# Patient Record
Sex: Male | Born: 1970
Health system: Southern US, Community
[De-identification: ages and names within clinical notes are randomized; demographics above are authoritative.]

## PROBLEM LIST (undated history)

## (undated) DIAGNOSIS — L0390 Cellulitis, unspecified: Secondary | ICD-10-CM

## (undated) HISTORY — PX: WISDOM TOOTH EXTRACTION: SHX21

---

## 2011-03-23 ENCOUNTER — Emergency Department (INDEPENDENT_AMBULATORY_CARE_PROVIDER_SITE_OTHER): Payer: 59

## 2011-03-23 ENCOUNTER — Emergency Department (HOSPITAL_BASED_OUTPATIENT_CLINIC_OR_DEPARTMENT_OTHER)
Admission: EM | Admit: 2011-03-23 | Discharge: 2011-03-23 | Disposition: A | Discharge status: Home / Self Care | Payer: 59 | Attending: Emergency Medicine | Admitting: Emergency Medicine

## 2011-03-23 DIAGNOSIS — M25579 Pain in unspecified ankle and joints of unspecified foot: Secondary | ICD-10-CM

## 2011-03-23 DIAGNOSIS — S93409A Sprain of unspecified ligament of unspecified ankle, initial encounter: Secondary | ICD-10-CM | POA: Insufficient documentation

## 2011-03-23 DIAGNOSIS — Y92009 Unspecified place in unspecified non-institutional (private) residence as the place of occurrence of the external cause: Secondary | ICD-10-CM | POA: Insufficient documentation

## 2011-03-23 DIAGNOSIS — W108XXA Fall (on) (from) other stairs and steps, initial encounter: Secondary | ICD-10-CM | POA: Insufficient documentation

## 2011-04-16 ENCOUNTER — Other Ambulatory Visit: Payer: Self-pay | Admitting: Otolaryngology

## 2011-04-25 ENCOUNTER — Ambulatory Visit
Admission: RE | Admit: 2011-04-25 | Discharge: 2011-04-25 | Disposition: A | Discharge status: Home / Self Care | Payer: 59 | Source: Ambulatory Visit | Attending: Otolaryngology | Admitting: Otolaryngology

## 2014-07-27 ENCOUNTER — Emergency Department (HOSPITAL_BASED_OUTPATIENT_CLINIC_OR_DEPARTMENT_OTHER)
Admission: EM | Admit: 2014-07-27 | Discharge: 2014-07-27 | Disposition: A | Discharge status: Home / Self Care | Payer: 59 | Attending: Emergency Medicine | Admitting: Emergency Medicine

## 2014-07-27 ENCOUNTER — Emergency Department (HOSPITAL_BASED_OUTPATIENT_CLINIC_OR_DEPARTMENT_OTHER): Payer: 59

## 2014-07-27 ENCOUNTER — Encounter (HOSPITAL_BASED_OUTPATIENT_CLINIC_OR_DEPARTMENT_OTHER): Payer: Self-pay | Admitting: Emergency Medicine

## 2014-07-27 DIAGNOSIS — S2232XA Fracture of one rib, left side, initial encounter for closed fracture: Secondary | ICD-10-CM

## 2014-07-27 DIAGNOSIS — S2242XA Multiple fractures of ribs, left side, initial encounter for closed fracture: Secondary | ICD-10-CM | POA: Insufficient documentation

## 2014-07-27 DIAGNOSIS — Y9389 Activity, other specified: Secondary | ICD-10-CM | POA: Diagnosis not present

## 2014-07-27 DIAGNOSIS — Y9289 Other specified places as the place of occurrence of the external cause: Secondary | ICD-10-CM | POA: Insufficient documentation

## 2014-07-27 DIAGNOSIS — W19XXXA Unspecified fall, initial encounter: Secondary | ICD-10-CM

## 2014-07-27 DIAGNOSIS — S299XXA Unspecified injury of thorax, initial encounter: Secondary | ICD-10-CM | POA: Diagnosis present

## 2014-07-27 MED ORDER — HYDROCODONE-ACETAMINOPHEN 5-325 MG PO TABS: 1.0000 | ORAL_TABLET | Freq: Four times a day (QID) | ORAL | Status: DC | PRN
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
## 2014-07-27 NOTE — Discharge Instructions (Signed)
Hydrocodone as prescribed as needed for pain.  Return to the emergency department if your pain worsens, you develop difficulty breathing, productive cough and fever, or other new and concerning symptoms.   Rib Fracture A rib fracture is a break or crack in one of the bones of the ribs. The ribs are a group of long, curved bones that wrap around your chest and attach to your spine. They protect your lungs and other organs in the chest cavity. A broken or cracked rib is often painful, but most do not cause other problems. Most rib fractures heal on their own over time. However, rib fractures can be more serious if multiple ribs are broken or if broken ribs move out of place and push against other structures. CAUSES   A direct blow to the chest. For example, this could happen during contact sports, a car accident, or a fall against a hard object.  Repetitive movements with high force, such as pitching a baseball or having severe coughing spells. SYMPTOMS   Pain when you breathe in or cough.  Pain when someone presses on the injured area. DIAGNOSIS  Your caregiver will perform a physical exam. Various imaging tests may be ordered to confirm the diagnosis and to look for related injuries. These tests may include a chest X-ray, computed tomography (CT), magnetic resonance imaging (MRI), or a bone scan. TREATMENT  Rib fractures usually heal on their own in 1-3 months. The longer healing period is often associated with a continued cough or other aggravating activities. During the healing period, pain control is very important. Medication is usually given to control pain. Hospitalization or surgery may be needed for more severe injuries, such as those in which multiple ribs are broken or the ribs have moved out of place.  HOME CARE INSTRUCTIONS   Avoid strenuous activity and any activities or movements that cause pain. Be careful during activities and avoid bumping the injured rib.  Gradually  increase activity as directed by your caregiver.  Only take over-the-counter or prescription medications as directed by your caregiver. Do not take other medications without asking your caregiver first.  Apply ice to the injured area for the first 1-2 days after you have been treated or as directed by your caregiver. Applying ice helps to reduce inflammation and pain.  Put ice in a plastic bag.  Place a towel between your skin and the bag.   Leave the ice on for 15-20 minutes at a time, every 2 hours while you are awake.  Perform deep breathing as directed by your caregiver. This will help prevent pneumonia, which is a common complication of a broken rib. Your caregiver may instruct you to:  Take deep breaths several times a day.  Try to cough several times a day, holding a pillow against the injured area.  Use a device called an incentive spirometer to practice deep breathing several times a day.  Drink enough fluids to keep your urine clear or pale yellow. This will help you avoid constipation.   Do not wear a rib belt or binder. These restrict breathing, which can lead to pneumonia.  SEEK IMMEDIATE MEDICAL CARE IF:   You have a fever.   You have difficulty breathing or shortness of breath.   You develop a continual cough, or you cough up thick or bloody sputum.  You feel sick to your stomach (nausea), throw up (vomit), or have abdominal pain.   You have worsening pain not controlled with medications.  MAKE SURE  YOU:  Understand these instructions.  Will watch your condition.  Will get help right away if you are not doing well or get worse. Document Released: 09/15/2005 Document Revised: 05/18/2013 Document Reviewed: 11/17/2012 [facility] Patient Information 2015 [place], [place]. This information is not intended to replace advice given to you by your health care provider. Make sure you discuss any questions you have with your health care provider.
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
## 2015-10-12 ENCOUNTER — Emergency Department (HOSPITAL_BASED_OUTPATIENT_CLINIC_OR_DEPARTMENT_OTHER): Payer: 59

## 2015-10-12 ENCOUNTER — Encounter (HOSPITAL_BASED_OUTPATIENT_CLINIC_OR_DEPARTMENT_OTHER): Payer: Self-pay | Admitting: *Deleted

## 2015-10-12 ENCOUNTER — Emergency Department (HOSPITAL_BASED_OUTPATIENT_CLINIC_OR_DEPARTMENT_OTHER)
Admission: EM | Admit: 2015-10-12 | Discharge: 2015-10-13 | Disposition: A | Discharge status: Home / Self Care | Payer: 59 | Attending: Emergency Medicine | Admitting: Emergency Medicine

## 2015-10-12 DIAGNOSIS — R079 Chest pain, unspecified: Secondary | ICD-10-CM | POA: Diagnosis present

## 2015-10-12 LAB — CBC
HCT: 40.6 % (ref 39.0–52.0)
HEMOGLOBIN: 13.3 g/dL (ref 13.0–17.0)
MCH: 26.9 pg (ref 26.0–34.0)
MCHC: 32.8 g/dL (ref 30.0–36.0)
MCV: 82.2 fL (ref 78.0–100.0)
PLATELETS: 190 10*3/uL (ref 150–400)
RBC: 4.94 MIL/uL (ref 4.22–5.81)
RDW: 12.9 % (ref 11.5–15.5)
WBC: 5.3 10*3/uL (ref 4.0–10.5)

## 2015-10-12 LAB — BASIC METABOLIC PANEL
ANION GAP: 8 (ref 5–15)
BUN: 11 mg/dL (ref 6–20)
CALCIUM: 8.8 mg/dL — AB (ref 8.9–10.3)
CO2: 27 mmol/L (ref 22–32)
CREATININE: 1.18 mg/dL (ref 0.61–1.24)
Chloride: 104 mmol/L (ref 101–111)
Glucose, Bld: 127 mg/dL — ABNORMAL HIGH (ref 65–99)
Potassium: 3.8 mmol/L (ref 3.5–5.1)
SODIUM: 139 mmol/L (ref 135–145)

## 2015-10-12 LAB — TROPONIN I

## 2015-10-12 NOTE — ED Provider Notes (Signed)
CSN: [id number]     Arrival date & time 10/12/15  2016 History  By signing my name below, I, Bethel Born, attest that this documentation has been prepared under the direction and in the presence of Leta Baptist, MD. Electronically Signed: Bethel Born, ED Scribe. 10/12/2015. 10:54 PM   Chief Complaint  Patient presents with  . Chest Pain   The history is provided by the patient. No language interpreter was used.   Zachary York is a 45 y.o. male with no significant PMHx who presents to the Emergency Department complaining of "intense" cramping, non-radiating, left-sided chest pain with onset tonight near 7:30 PM after he had a sandwich and chips for dinner.  He took aspirin and Tums at [place] with some improvement in pain. Additionally the pain improved after belching and he is pain free at present.  This pain initially felt like the heartburn/indigestion that he frequently experiences but more severe. Pt denies SOB but notes that the chest pain took his breath away. Pt states that he has had a low-grade fever and neck pain over the last 2 days that he is treating with Aleve (last dose near 7 PM).  He has no history of medically treated HTN or DM.  His father died of a heart attack at 49. His last stress test was 4-5 years ago and normal.   History reviewed. No pertinent past medical history. History reviewed. No pertinent past surgical history. No family history on file. Social History  Substance Use Topics  . Smoking status: Never Smoker   . Smokeless tobacco: None  . Alcohol Use: Yes    Review of Systems  Constitutional: Negative for fever and chills.  HENT: Negative for congestion, postnasal drip, rhinorrhea and sinus pressure.   Respiratory: Negative for chest tightness, shortness of breath and wheezing.   Cardiovascular: Positive for chest pain.  Gastrointestinal: Negative for nausea, vomiting, abdominal pain, diarrhea and constipation.  Genitourinary: Negative for  dysuria, urgency, frequency and hematuria.  Musculoskeletal: Negative for myalgias and back pain.  Skin: Negative for rash.  Neurological: Negative for dizziness, weakness, light-headedness and headaches.  Hematological: Does not bruise/bleed easily.  All other systems reviewed and are negative.  Allergies  Review of patient's allergies indicates no known allergies.  Home Medications   Prior to Admission medications   Medication Sig Start Date End Date Taking? Authorizing Provider  HYDROcodone-acetaminophen (NORCO) 5-325 MG per tablet Take 1-2 tablets by mouth every 6 (six) hours as needed. 07/27/14   Geoffery Lyons, MD   BP 136/96 mmHg  Pulse 84  Temp(Src) 98.4 F (36.9 C) (Oral)  Resp 19  Ht 6' (1.829 m)  Wt 195 lb (88.451 kg)  BMI 26.44 kg/m2  SpO2 99% Physical Exam  Constitutional: He is oriented to person, place, and time. He appears well-developed and well-nourished.  HENT:  Head: Normocephalic and atraumatic.  Eyes: EOM are normal.  Neck: Normal range of motion.  Cardiovascular: Normal rate, regular rhythm, normal heart sounds and intact distal pulses.   Pulmonary/Chest: Effort normal and breath sounds normal. No respiratory distress.  Abdominal: Soft. He exhibits no distension. There is no tenderness.  Musculoskeletal: Normal range of motion.  Neurological: He is alert and oriented to person, place, and time.  Skin: Skin is warm and dry.  Psychiatric: He has a normal mood and affect. Judgment normal.  Nursing note and vitals reviewed.   ED Course  Procedures (including critical care time) DIAGNOSTIC STUDIES: Oxygen Saturation is 99% on RA,  normal  by my interpretation.    COORDINATION OF CARE: 10:49 PM Discussed treatment plan which includes lab work, CXR, EKG with pt at bedside and pt agreed to plan.  Labs Review Labs Reviewed  BASIC METABOLIC PANEL - Abnormal; Notable for the following:    Glucose, Bld 127 (*)    Calcium 8.8 (*)    All other components  within normal limits  CBC  TROPONIN I    Imaging Review Dg Chest 2 View  10/12/2015  CLINICAL DATA:  Acute onset left-sided chest pain. EXAM: CHEST  2 VIEW COMPARISON:  07/27/2014 FINDINGS: The heart size and mediastinal contours are within normal limits. Both lungs are clear. No evidence of pneumothorax or pleural effusion. The visualized skeletal structures are unremarkable. IMPRESSION: No active cardiopulmonary disease. Electronically Signed   By: Myles RosenthalJohn  Stahl M.D.   On: 10/12/2015 22:06   I have personally reviewed and evaluated these images and lab results as part of my medical decision-making.   EKG Interpretation   Date/Time:  Friday October 12 2015 20:22:19 EST Ventricular Rate:  97 PR Interval:  154 QRS Duration: 94 QT Interval:  326 QTC Calculation: 414 R Axis:   47 Text Interpretation:  Normal sinus rhythm Normal ECG No previous ECGs  available Confirmed by Tamanika Heiney ([id number]) on 10/12/2015 10:16:42 PM      MDM  Patient was seen and evaluated in stable condition. Currently chest pain-free. Took aspirin prior to presentation. EKG unremarkable. Initial laboratory studies including first troponin normal. Heart scored 2-3. This places the patient at low risk for acute cardiac event. Patient signed out pending repeat troponin. Troponin negative plan for discharge. Patient will follow up with his primary care physician. Final diagnoses:  None    1. Chest pain  I personally performed the services described in this documentation, which was scribed in my presence. The recorded information has been reviewed and is accurate.    Leta BaptistEmily Roe Zada Haser, MD 10/13/15 [phone]

## 2015-10-12 NOTE — ED Notes (Addendum)
Chest pain he states is a bad case of indigestion. Pain in his left chest. He took ASA and Tums. He gets relief with belching.

## 2015-10-13 LAB — TROPONIN I: Troponin I: <0.03 ng/mL (ref <0.031)

## 2015-10-13 NOTE — Discharge Instructions (Signed)
You were seen and evaluated tonight for your episode of chest pain. The results from the emergency department are reassuring. The exact cause of your chest pain is unclear although this could be indigestion. Follow-up with your primary care physician for further evaluation. Return with sudden severe symptoms or new concerning symptoms.   Nonspecific Chest Pain  Chest pain can be caused by many different conditions. There is always a chance that your pain could be related to something serious, such as a heart attack or a blood clot in your lungs. Chest pain can also be caused by conditions that are not life-threatening. If you have chest pain, it is very important to follow up with your health care provider. CAUSES  Chest pain can be caused by:  Heartburn.  Pneumonia or bronchitis.  Anxiety or stress.  Inflammation around your heart (pericarditis) or lung (pleuritis or pleurisy).  A blood clot in your lung.  A collapsed lung (pneumothorax). It can develop suddenly on its own (spontaneous pneumothorax) or from trauma to the chest.  Shingles infection (varicella-zoster virus).  Heart attack.  Damage to the bones, muscles, and cartilage that make up your chest wall. This can include:  Bruised bones due to injury.  Strained muscles or cartilage due to frequent or repeated coughing or overwork.  Fracture to one or more ribs.  Sore cartilage due to inflammation (costochondritis). RISK FACTORS  Risk factors for chest pain may include:  Activities that increase your risk for trauma or injury to your chest.  Respiratory infections or conditions that cause frequent coughing.  Medical conditions or overeating that can cause heartburn.  Heart disease or family history of heart disease.  Conditions or health behaviors that increase your risk of developing a blood clot.  Having had chicken pox (varicella zoster). SIGNS AND SYMPTOMS Chest pain can feel like:  Burning or tingling on  the surface of your chest or deep in your chest.  Crushing, pressure, aching, or squeezing pain.  Dull or sharp pain that is worse when you move, cough, or take a deep breath.  Pain that is also felt in your back, neck, shoulder, or arm, or pain that spreads to any of these areas. Your chest pain may come and go, or it may stay constant. DIAGNOSIS Lab tests or other studies may be needed to find the cause of your pain. Your health care provider may have you take a test called an ambulatory ECG (electrocardiogram). An ECG records your heartbeat patterns at the time the test is performed. You may also have other tests, such as:  Transthoracic echocardiogram (TTE). During echocardiography, sound waves are used to create a picture of all of the heart structures and to look at how blood flows through your heart.  Transesophageal echocardiogram (TEE).This is a more advanced imaging test that obtains images from inside your body. It allows your health care provider to see your heart in finer detail.  Cardiac monitoring. This allows your health care provider to monitor your heart rate and rhythm in real time.  Holter monitor. This is a portable device that records your heartbeat and can help to diagnose abnormal heartbeats. It allows your health care provider to track your heart activity for several days, if needed.  Stress tests. These can be done through exercise or by taking medicine that makes your heart beat more quickly.  Blood tests.  Imaging tests. TREATMENT  Your treatment depends on what is causing your chest pain. Treatment may include:  Medicines. These may  include:  Acid blockers for heartburn.  Anti-inflammatory medicine.  Pain medicine for inflammatory conditions.  Antibiotic medicine, if an infection is present.  Medicines to dissolve blood clots.  Medicines to treat coronary artery disease.  Supportive care for conditions that do not require medicines. This may  include:  Resting.  Applying heat or cold packs to injured areas.  Limiting activities until pain decreases. HOME CARE INSTRUCTIONS  If you were prescribed an antibiotic medicine, finish it all even if you start to feel better.  Avoid any activities that bring on chest pain.  Do not use any tobacco products, including cigarettes, chewing tobacco, or electronic cigarettes. If you need help quitting, ask your health care provider.  Do not drink alcohol.  Take medicines only as directed by your health care provider.  Keep all follow-up visits as directed by your health care provider. This is important. This includes any further testing if your chest pain does not go away.  If heartburn is the cause for your chest pain, you may be told to keep your head raised (elevated) while sleeping. This reduces the chance that acid will go from your stomach into your esophagus.  Make lifestyle changes as directed by your health care provider. These may include:  Getting regular exercise. Ask your health care provider to suggest some activities that are safe for you.  Eating a heart-healthy diet. A registered dietitian can help you to learn healthy eating options.  Maintaining a healthy weight.  Managing diabetes, if necessary.  Reducing stress. SEEK MEDICAL CARE IF:  Your chest pain does not go away after treatment.  You have a rash with blisters on your chest.  You have a fever. SEEK IMMEDIATE MEDICAL CARE IF:   Your chest pain is worse.  You have an increasing cough, or you cough up blood.  You have severe abdominal pain.  You have severe weakness.  You faint.  You have chills.  You have sudden, unexplained chest discomfort.  You have sudden, unexplained discomfort in your arms, back, neck, or jaw.  You have shortness of breath at any time.  You suddenly start to sweat, or your skin gets clammy.  You feel nauseous or you vomit.  You suddenly feel light-headed or  dizzy.  Your heart begins to beat quickly, or it feels like it is skipping beats. These symptoms may represent a serious problem that is an emergency. Do not wait to see if the symptoms will go away. Get medical help right away. Call your local emergency services (911 in the [place] Do not drive yourself to the hospital.   This information is not intended to replace advice given to you by your health care provider. Make sure you discuss any questions you have with your health care provider.   Document Released: 06/25/2005 Document Revised: 10/06/2014 Document Reviewed: 04/21/2014 Elsevier Interactive Patient Education [employer].

## 2017-02-13 DIAGNOSIS — H903 Sensorineural hearing loss, bilateral: Secondary | ICD-10-CM | POA: Diagnosis not present

## 2017-03-13 DIAGNOSIS — T161XXA Foreign body in right ear, initial encounter: Secondary | ICD-10-CM | POA: Diagnosis not present

## 2017-06-03 DIAGNOSIS — H903 Sensorineural hearing loss, bilateral: Secondary | ICD-10-CM | POA: Diagnosis not present

## 2017-06-17 DIAGNOSIS — H00014 Hordeolum externum left upper eyelid: Secondary | ICD-10-CM | POA: Diagnosis not present

## 2017-12-28 DIAGNOSIS — L0390 Cellulitis, unspecified: Secondary | ICD-10-CM

## 2017-12-28 HISTORY — DX: Cellulitis, unspecified: L03.90

## 2018-01-16 DIAGNOSIS — K047 Periapical abscess without sinus: Secondary | ICD-10-CM | POA: Diagnosis not present

## 2018-01-19 ENCOUNTER — Emergency Department (HOSPITAL_COMMUNITY): Payer: 59

## 2018-01-19 ENCOUNTER — Encounter (HOSPITAL_COMMUNITY): Payer: Self-pay

## 2018-01-19 ENCOUNTER — Observation Stay (HOSPITAL_COMMUNITY)
Admission: EM | Admit: 2018-01-19 | Discharge: 2018-01-21 | Disposition: A | Discharge status: Home / Self Care | Payer: 59 | Attending: Family Medicine | Admitting: Family Medicine

## 2018-01-19 ENCOUNTER — Other Ambulatory Visit: Payer: Self-pay

## 2018-01-19 DIAGNOSIS — L03221 Cellulitis of neck: Secondary | ICD-10-CM | POA: Diagnosis present

## 2018-01-19 DIAGNOSIS — R221 Localized swelling, mass and lump, neck: Secondary | ICD-10-CM | POA: Diagnosis not present

## 2018-01-19 HISTORY — DX: Cellulitis, unspecified: L03.90

## 2018-01-19 LAB — CBC WITH DIFFERENTIAL/PLATELET
Basophils Absolute: 0 10*3/uL (ref 0.0–0.1)
Basophils Relative: 0 %
EOS ABS: 0.2 10*3/uL (ref 0.0–0.7)
Eosinophils Relative: 2 %
HEMATOCRIT: 43.3 % (ref 39.0–52.0)
HEMOGLOBIN: 14.4 g/dL (ref 13.0–17.0)
LYMPHS ABS: 2.5 10*3/uL (ref 0.7–4.0)
LYMPHS PCT: 28 %
MCH: 28.9 pg (ref 26.0–34.0)
MCHC: 33.3 g/dL (ref 30.0–36.0)
MCV: 86.8 fL (ref 78.0–100.0)
Monocytes Absolute: 0.8 10*3/uL (ref 0.1–1.0)
Monocytes Relative: 9 %
NEUTROS ABS: 5.3 10*3/uL (ref 1.7–7.7)
NEUTROS PCT: 61 %
Platelets: 219 10*3/uL (ref 150–400)
RBC: 4.99 MIL/uL (ref 4.22–5.81)
RDW: 12.7 % (ref 11.5–15.5)
WBC: 8.8 10*3/uL (ref 4.0–10.5)

## 2018-01-19 LAB — BASIC METABOLIC PANEL
Anion gap: 10 (ref 5–15)
BUN: 10 mg/dL (ref 6–20)
CHLORIDE: 101 mmol/L (ref 101–111)
CO2: 25 mmol/L (ref 22–32)
Calcium: 8.9 mg/dL (ref 8.9–10.3)
Creatinine, Ser: 1.03 mg/dL (ref 0.61–1.24)
GFR calc Af Amer: >60 mL/min (ref >60)
GFR calc non Af Amer: >60 mL/min (ref >60)
Glucose, Bld: 91 mg/dL (ref 65–99)
POTASSIUM: 3.8 mmol/L (ref 3.5–5.1)
SODIUM: 136 mmol/L (ref 135–145)

## 2018-01-19 LAB — I-STAT CG4 LACTIC ACID, ED: LACTIC ACID, VENOUS: 1.35 mmol/L (ref 0.5–1.9)

## 2018-01-19 MED ORDER — IOPAMIDOL (ISOVUE-300) INJECTION 61%
75.0000 mL | Freq: Once | INTRAVENOUS | Status: AC | PRN
  Administered 2018-01-19: 75 mL via INTRAVENOUS

## 2018-01-19 MED ORDER — IOPAMIDOL (ISOVUE-300) INJECTION 61%
INTRAVENOUS | Status: AC
  Filled 2018-01-19: qty 100

## 2018-01-19 NOTE — ED Provider Notes (Signed)
Patient placed in Quick Look pathway, seen and evaluated   Chief Complaint: Dental pain, swelling  HPI:   Patient presents for evaluation of facial swelling and neck swelling.  Symptoms began on Friday 5 days ago with left lower jaw pain from an abscess tooth.  [place] in [place]  and was seen at an urgent care on Saturday and discharged with Augmentin, Decadron, oxycodone, and ibuprofen.  He was seen and evaluated by an endodontist yesterday and underwent a root canal and discharged with Flagyl.  He did not require general anesthesia on the local.  He has had left lower facial and jaw swelling since Friday but developed sudden onset anterior neck pain and swelling yesterday which worsened today.  His endodontist recommended he present to the ED for further evaluation.  The patient denies any throat tightness or drooling or difficulty swallowing.  No sore throat.  No fevers.  ROS: Positive for facial swelling, neck pain negative for fevers, chills, shortness of breath, drooling, chest pain  Physical Exam:   Gen: No distress  Neuro: Awake and Alert  Skin: Warm    Focused Exam: Left mandibular facial swelling and tenderness noted.  Patient exhibits trismus and sublingual and submental tenderness to palpation.  There is also an area of swelling and erythema inferiorly to the anterior neck.  This area is tender to palpation.  He is tolerating secretions without difficulty and speaking in full sentences with good phonation.  Normal range of motion of the cervical spine, no pain to the neck anteriorly with range of motion.   Initiation of care has begun. The patient has been counseled on the process, plan, and necessity for staying for the completion/evaluation, and the remainder of the medical screening examination    Bennye AlmFawze, Ramatoulaye Pack A, PA-C 01/19/18 2041    Shon BatonHorton, Courtney F, MD 01/20/18 0127

## 2018-01-19 NOTE — ED Triage Notes (Signed)
Pt presents with redness and swelling to L jaw and neck since yesterday.  Pt had root canal yesterday, reports he's on abx but reports swelling has worsened to neck.  Pt reports he is able to swallow but endodontist referred him here due to swelling.

## 2018-01-20 ENCOUNTER — Other Ambulatory Visit: Payer: Self-pay

## 2018-01-20 ENCOUNTER — Encounter (HOSPITAL_COMMUNITY): Payer: Self-pay | Admitting: Family Medicine

## 2018-01-20 DIAGNOSIS — L03221 Cellulitis of neck: Secondary | ICD-10-CM | POA: Diagnosis not present

## 2018-01-20 LAB — I-STAT CG4 LACTIC ACID, ED: Lactic Acid, Venous: 1.8 mmol/L (ref 0.5–1.9)

## 2018-01-20 LAB — HIV ANTIBODY (ROUTINE TESTING W REFLEX): HIV Screen 4th Generation wRfx: NONREACTIVE

## 2018-01-20 MED ORDER — CLINDAMYCIN PHOSPHATE 600 MG/50ML IV SOLN
600.0000 mg | Freq: Four times a day (QID) | INTRAVENOUS | Status: DC
  Administered 2018-01-20 – 2018-01-21 (×5): 600 mg via INTRAVENOUS
  Filled 2018-01-20 (×8): qty 50

## 2018-01-20 MED ORDER — ONDANSETRON HCL 4 MG/2ML IJ SOLN: 4.0000 mg | Freq: Four times a day (QID) | INTRAMUSCULAR | Status: DC | PRN

## 2018-01-20 MED ORDER — BISACODYL 5 MG PO TBEC: 5.0000 mg | DELAYED_RELEASE_TABLET | Freq: Every day | ORAL | Status: DC | PRN

## 2018-01-20 MED ORDER — HYDROCODONE-ACETAMINOPHEN 5-325 MG PO TABS: 1.0000 | ORAL_TABLET | ORAL | Status: DC | PRN

## 2018-01-20 MED ORDER — SENNOSIDES-DOCUSATE SODIUM 8.6-50 MG PO TABS: 1.0000 | ORAL_TABLET | Freq: Every evening | ORAL | Status: DC | PRN

## 2018-01-20 MED ORDER — KETOROLAC TROMETHAMINE 30 MG/ML IJ SOLN: 30.0000 mg | Freq: Four times a day (QID) | INTRAMUSCULAR | Status: DC | PRN

## 2018-01-20 MED ORDER — ONDANSETRON HCL 4 MG PO TABS: 4.0000 mg | ORAL_TABLET | Freq: Four times a day (QID) | ORAL | Status: DC | PRN

## 2018-01-20 MED ORDER — CLINDAMYCIN PHOSPHATE 600 MG/50ML IV SOLN
600.0000 mg | Freq: Once | INTRAVENOUS | Status: AC
  Administered 2018-01-20: 600 mg via INTRAVENOUS
  Filled 2018-01-20: qty 50

## 2018-01-20 MED ORDER — ACETAMINOPHEN 325 MG PO TABS: 650.0000 mg | ORAL_TABLET | Freq: Four times a day (QID) | ORAL | Status: DC | PRN

## 2018-01-20 MED ORDER — ACETAMINOPHEN 650 MG RE SUPP: 650.0000 mg | Freq: Four times a day (QID) | RECTAL | Status: DC | PRN

## 2018-01-20 MED ORDER — ENOXAPARIN SODIUM 40 MG/0.4ML ~~LOC~~ SOLN
40.0000 mg | SUBCUTANEOUS | Status: DC
  Administered 2018-01-20: 40 mg via SUBCUTANEOUS
  Filled 2018-01-20: qty 0.4

## 2018-01-20 MED ORDER — HYDRALAZINE HCL 20 MG/ML IJ SOLN: 10.0000 mg | INTRAMUSCULAR | Status: DC | PRN

## 2018-01-20 NOTE — ED Notes (Signed)
Pt. Alert and eating, family @ bedside. Vital signs stable at present.

## 2018-01-20 NOTE — Progress Notes (Signed)
PROGRESS NOTE    Constance HolsterWilliam Footman  UJW:[id number]:[id number] DOB: 07/09/1971 DOA: 01/19/2018 PCP: Merri BrunetteSmith, Candace, MD   Brief Narrative:  47 y.o. male who typically enjoys good health, now presenting to the emergency department for evaluation of pain, swelling, and redness involving the left jaw and neck with imaging findings c/w cellulitis.   Assessment & Plan:   Principal Problem:   Cellulitis, neck Active Problems:   Cellulitis of neck   L neck cellulitis: Presented with increased redness, swelling and pain despite abx and root canal on Monday.   Augmentin since Saturday (4/20) and flagyl added Monday (4/22).  - CT with asymmetric soft tissue stranding with inflammatory stranding involving L neck involving the L submandibular and parapharyngeal spaces c/w infection/celllitis (no descrete abscess or drainable fluid collection) - Afebrile, no leukocytosis - Started on IV clindamycin - he feels like things may be slightly improving since then - follow blood cx, MRSA pcr  DVT prophylaxis: lovenox Code Status: full  Family Communication: none at bedside Disposition Plan: pending improvement   Consultants:   none  Procedures:   none  Antimicrobials:  Anti-infectives (From admission, onward)   Start     Dose/Rate Route Frequency Ordered Stop   01/20/18 0800  clindamycin (CLEOCIN) IVPB 600 mg     600 mg 100 mL/hr over 30 Minutes Intravenous Every 6 hours 01/20/18 0150     01/20/18 0115  clindamycin (CLEOCIN) IVPB 600 mg     600 mg 100 mL/hr over 30 Minutes Intravenous  Once 01/20/18 0103 01/20/18 0304       Subjective: No difficulty swallowing. No trouble breathing. Soreness to L side of neck. Seems Yitzchok Carriger bit better since IV abx started.  Objective: Vitals:   01/20/18 0300 01/20/18 0315 01/20/18 0330 01/20/18 0404  BP: (!) 139/104 (!) 127/97 (!) 128/95 (!) 138/97  Pulse: 75 74 73 76  Resp: 18   16  Temp:      TempSrc:      SpO2: 99% 97% 97% 97%  Weight:      Height:         Intake/Output Summary (Last 24 hours) at 01/20/2018 1310 Last data filed at 01/20/2018 0953 Gross per 24 hour  Intake 220 ml  Output -  Net 220 ml   Filed Weights   01/19/18 1940  Weight: 88.5 kg (195 lb)    Examination:  General exam: Appears calm and comfortable  Respiratory system: Clear to auscultation. Respiratory effort normal. HEENT: submandibular TTP on L, TTP underneath tongue on L, but soft Cardiovascular system: S1 & S2 heard, RRR. No JVD, murmurs, rubs, gallops or clicks. No pedal edema. Gastrointestinal system: Abdomen is nondistended, soft and nontender. No organomegaly or masses felt. Normal bowel sounds heard. Central nervous system: Alert and oriented. No focal neurological deficits. Extremities: Symmetric 5 x 5 power. Skin: No rashes, lesions or ulcers Psychiatry: Judgement and insight appear normal. Mood & affect appropriate.     Data Reviewed: I have personally reviewed following labs and imaging studies  CBC: Recent Labs  Lab 01/19/18 2005  WBC 8.8  NEUTROABS 5.3  HGB 14.4  HCT 43.3  MCV 86.8  PLT 219   Basic Metabolic Panel: Recent Labs  Lab 01/19/18 2005  NA 136  K 3.8  CL 101  CO2 25  GLUCOSE 91  BUN 10  CREATININE 1.03  CALCIUM 8.9   GFR: Estimated Creatinine Clearance: 96.3 mL/min (by C-G formula based on SCr of 1.03 mg/dL). Liver Function Tests: No results for  input(s): AST, ALT, ALKPHOS, BILITOT, PROT, ALBUMIN in the last 168 hours. No results for input(s): LIPASE, AMYLASE in the last 168 hours. No results for input(s): AMMONIA in the last 168 hours. Coagulation Profile: No results for input(s): INR, PROTIME in the last 168 hours. Cardiac Enzymes: No results for input(s): CKTOTAL, CKMB, CKMBINDEX, TROPONINI in the last 168 hours. BNP (last 3 results) No results for input(s): PROBNP in the last 8760 hours. HbA1C: No results for input(s): HGBA1C in the last 72 hours. CBG: No results for input(s): GLUCAP in the last 168  hours. Lipid Profile: No results for input(s): CHOL, HDL, LDLCALC, TRIG, CHOLHDL, LDLDIRECT in the last 72 hours. Thyroid Function Tests: No results for input(s): TSH, T4TOTAL, FREET4, T3FREE, THYROIDAB in the last 72 hours. Anemia Panel: No results for input(s): VITAMINB12, FOLATE, FERRITIN, TIBC, IRON, RETICCTPCT in the last 72 hours. Sepsis Labs: Recent Labs  Lab 01/19/18 2108 01/20/18 0133  LATICACIDVEN 1.35 1.80    No results found for this or any previous visit (from the past 240 hour(s)).       Radiology Studies: Ct Soft Tissue Neck W Contrast  Result Date: 01/19/2018 CLINICAL DATA:  Initial evaluation for acute left-sided neck swelling, recent dental surgery. EXAM: CT NECK WITH CONTRAST TECHNIQUE: Multidetector CT imaging of the neck was performed using the standard protocol following the bolus administration of intravenous contrast. CONTRAST:  75mL ISOVUE-300 IOPAMIDOL (ISOVUE-300) INJECTION 61% COMPARISON:  None. FINDINGS: Pharynx and larynx: Oral cavity within normal limits without mass lesion or loculated fluid collection. There is asymmetric soft tissue swelling with inflammatory stranding adjacent to the left mandibular body, with additional swelling and inflammatory stranding within the overlying left masticator and submandibular spaces. Findings concerning for possible infection/cellulitis. Asymmetric thickening of the left platysmas. Inflammatory stranding extends inferiorly down the anterior aspect of the left and central neck. Extension into the left parapharyngeal space with mild induration within the left parapharyngeal fat. No discrete abscess or drainable fluid collection identified. Oropharynx within normal limits. Palatine tonsils symmetric and within normal limits. Nasopharynx normal. No retropharyngeal collection. Minimal asymmetric edema at the left epiglottis/aryepiglottic folds related to the inflammatory process within the left neck. Remainder of the hypopharynx  and supraglottic larynx within normal limits. True cords symmetric and normal. Subglottic airway clear. Salivary glands: Parotid glands within normal limits. Inflammatory stranding surrounds the left submandibular gland while the gland itself is normal in appearance. Right submandibular gland normal. Thyroid: Approximately 1 cm hypodense nodule noted within the inferior left thyroid lobe, of doubtful significance. Thyroid otherwise unremarkable. Lymph nodes: Mild asymmetric prominence of nonenlarged left level IB nodes, likely reactive. No pathologically enlarged lymph nodes identified within the neck. Vascular: Normal intravascular enhancement seen throughout the neck. Small focus of gas within the left IJ likely related IV access/injection. Limited intracranial: Unremarkable. Visualized orbits: Visualized globes and orbital soft tissues within normal limits. Mastoids and visualized paranasal sinuses: Visualized paranasal sinuses are clear. Mastoids and middle ear cavities are clear. Skeleton: No acute osseous abnormality. No worrisome lytic or blastic osseous lesions. Mild degenerate spondylolysis noted at C5-6 and C6-7. Upper chest: Visualized upper chest within normal limits. Partially visualized lungs are clear. Other: None. IMPRESSION: Asymmetric soft tissue stranding with inflammatory stranding involving the left neck, primarily involving the left submandibular and parapharyngeal spaces, most consistent with infection/cellulitis. No discrete abscess or drainable fluid collection identified. Electronically Signed   By: Rise Mu M.D.   On: 01/19/2018 22:05        Scheduled Meds: .  enoxaparin (LOVENOX) injection  40 mg Subcutaneous Q24H   Continuous Infusions: . clindamycin (CLEOCIN) IV Stopped (01/20/18 1247)     LOS: 0 days    Time spent: over 30 min    Lacretia Nicks, MD [facility] Pager [phone]  If 7PM-7AM, please contact  night-coverage [email or web address] Password [email or web address] 01/20/2018, 1:10 PM

## 2018-01-20 NOTE — H&P (Signed)
History and Physical    Zachary HolsterWilliam York UJW:[id number]:[id number] DOB: 12/05/1970 DOA: 01/19/2018  PCP: Merri BrunetteSmith, Candace, MD   Patient coming from: Home  Chief Complaint: Left neck and jaw swelling   HPI: Zachary HolsterWilliam York is a 47 y.o. male who typically enjoys good health, now presenting to the emergency department for evaluation of pain, swelling, and redness involving the left jaw and neck.  Patient developed pain, redness, and swelling localized to the left jaw approximately 6 days ago, was seen in an urgent care and started on Augmentin at that time for odontogenic infection.  He then underwent root canal on 01/18/2018 and Flagyl was added to his Augmentin that he has continued.  Today, he noted increasing swelling, redness, and discomfort extending to the left neck.  He denies any difficulty swallowing, denies drooling, and denies any stridor or noisy breathing.  There has not been any fevers or chills.  ED Course: Upon arrival to the ED, patient is found to be afebrile, saturating well on room air, hypertensive, and vitals otherwise normal.  Chemistry panel and CBC are unremarkable and lactic acid is reassuringly normal.  CT neck soft tissue reveals soft tissue stranding involving the left neck, primarily left submandibular and parapharyngeal spaces without fluid collection, consistent with cellulitis.  Blood cultures were collected in the ED and patient was started on IV clindamycin.  He remains hemodynamically stable, in no apparent respiratory distress, and will be observed on the medical-surgical unit for ongoing evaluation and management of worsening cellulitis despite outpatient antibiotics.  Review of Systems:  All other systems reviewed and apart from HPI, are negative.  History reviewed. No pertinent past medical history.  History reviewed. No pertinent surgical history.   reports that he has never smoked. He has never used smokeless tobacco. He reports that he drinks alcohol. His drug history is not  on file.  No Known Allergies  History reviewed. No pertinent family history.   Prior to Admission medications   Medication Sig Start Date End Date Taking? Authorizing Provider  amoxicillin-clavulanate (AUGMENTIN) 875-125 MG tablet Take 1 tablet by mouth 2 (two) times daily. for 10 days 01/16/18  Yes [provider]  ibuprofen (ADVIL,MOTRIN) 800 MG tablet Take 800 mg by mouth every 8 (eight) hours as needed for moderate pain.  01/16/18  Yes [provider]  metroNIDAZOLE (FLAGYL) 500 MG tablet Take 500 mg by mouth every 6 (six) hours. 01/18/18  Yes [provider]    Physical Exam: Vitals:   01/19/18 1940 01/19/18 2229  BP: (!) 152/115 (!) 162/104  Pulse: 75 72  Resp: 18 18  Temp: 98.5 F (36.9 C)   TempSrc: Oral   SpO2: 100% 97%  Weight: 88.5 kg (195 lb)   Height: 6' (1.829 m)       Constitutional: NAD, calm  Eyes: PERTLA, lids and conjunctivae normal ENMT: Mucous membranes are moist. Posterior pharynx clear of any exudate or lesions.   Neck: erythema, edema, and tenderness involving left submandibular region and extending to anterior left neck. No fluctuance or drainage.  Respiratory: clear to auscultation bilaterally, no wheezing, no crackles. Normal respiratory effort.    Cardiovascular: S1 & S2 heard, regular rate and rhythm. No extremity edema.   Abdomen: No distension, no tenderness, soft. Bowel sounds normal.  Musculoskeletal: no clubbing / cyanosis. No joint deformity upper and lower extremities.   Skin: no significant rashes, lesions, ulcers. Warm, dry, well-perfused. Neurologic: CN 2-12 grossly intact. Sensation intact. Strength 5/5 in all 4 limbs.  Psychiatric:  Alert and oriented x 3. Calm, cooperative.     Labs on Admission: I have personally reviewed following labs and imaging studies  CBC: Recent Labs  Lab 01/19/18 2005  WBC 8.8  NEUTROABS 5.3  HGB 14.4  HCT 43.3  MCV 86.8  PLT 219   Basic Metabolic Panel: Recent Labs    Lab 01/19/18 2005  NA 136  K 3.8  CL 101  CO2 25  GLUCOSE 91  BUN 10  CREATININE 1.03  CALCIUM 8.9   GFR: Estimated Creatinine Clearance: 96.3 mL/min (by C-G formula based on SCr of 1.03 mg/dL). Liver Function Tests: No results for input(s): AST, ALT, ALKPHOS, BILITOT, PROT, ALBUMIN in the last 168 hours. No results for input(s): LIPASE, AMYLASE in the last 168 hours. No results for input(s): AMMONIA in the last 168 hours. Coagulation Profile: No results for input(s): INR, PROTIME in the last 168 hours. Cardiac Enzymes: No results for input(s): CKTOTAL, CKMB, CKMBINDEX, TROPONINI in the last 168 hours. BNP (last 3 results) No results for input(s): PROBNP in the last 8760 hours. HbA1C: No results for input(s): HGBA1C in the last 72 hours. CBG: No results for input(s): GLUCAP in the last 168 hours. Lipid Profile: No results for input(s): CHOL, HDL, LDLCALC, TRIG, CHOLHDL, LDLDIRECT in the last 72 hours. Thyroid Function Tests: No results for input(s): TSH, T4TOTAL, FREET4, T3FREE, THYROIDAB in the last 72 hours. Anemia Panel: No results for input(s): VITAMINB12, FOLATE, FERRITIN, TIBC, IRON, RETICCTPCT in the last 72 hours. Urine analysis: No results found for: COLORURINE, APPEARANCEUR, LABSPEC, PHURINE, GLUCOSEU, HGBUR, BILIRUBINUR, KETONESUR, PROTEINUR, UROBILINOGEN, NITRITE, LEUKOCYTESUR Sepsis Labs: @LABRCNTIP (procalcitonin:4,lacticidven:4) )No results found for this or any previous visit (from the past 240 hour(s)).   Radiological Exams on Admission: Ct Soft Tissue Neck W Contrast  Result Date: 01/19/2018 CLINICAL DATA:  Initial evaluation for acute left-sided neck swelling, recent dental surgery. EXAM: CT NECK WITH CONTRAST TECHNIQUE: Multidetector CT imaging of the neck was performed using the standard protocol following the bolus administration of intravenous contrast. CONTRAST:  75mL ISOVUE-300 IOPAMIDOL (ISOVUE-300) INJECTION 61% COMPARISON:  None. FINDINGS:  Pharynx and larynx: Oral cavity within normal limits without mass lesion or loculated fluid collection. There is asymmetric soft tissue swelling with inflammatory stranding adjacent to the left mandibular body, with additional swelling and inflammatory stranding within the overlying left masticator and submandibular spaces. Findings concerning for possible infection/cellulitis. Asymmetric thickening of the left platysmas. Inflammatory stranding extends inferiorly down the anterior aspect of the left and central neck. Extension into the left parapharyngeal space with mild induration within the left parapharyngeal fat. No discrete abscess or drainable fluid collection identified. Oropharynx within normal limits. Palatine tonsils symmetric and within normal limits. Nasopharynx normal. No retropharyngeal collection. Minimal asymmetric edema at the left epiglottis/aryepiglottic folds related to the inflammatory process within the left neck. Remainder of the hypopharynx and supraglottic larynx within normal limits. True cords symmetric and normal. Subglottic airway clear. Salivary glands: Parotid glands within normal limits. Inflammatory stranding surrounds the left submandibular gland while the gland itself is normal in appearance. Right submandibular gland normal. Thyroid: Approximately 1 cm hypodense nodule noted within the inferior left thyroid lobe, of doubtful significance. Thyroid otherwise unremarkable. Lymph nodes: Mild asymmetric prominence of nonenlarged left level IB nodes, likely reactive. No pathologically enlarged lymph nodes identified within the neck. Vascular: Normal intravascular enhancement seen throughout the neck. Small focus of gas within the left IJ likely related IV access/injection. Limited intracranial: Unremarkable. Visualized orbits: Visualized globes and orbital soft tissues within  normal limits. Mastoids and visualized paranasal sinuses: Visualized paranasal sinuses are clear. Mastoids and  middle ear cavities are clear. Skeleton: No acute osseous abnormality. No worrisome lytic or blastic osseous lesions. Mild degenerate spondylolysis noted at C5-6 and C6-7. Upper chest: Visualized upper chest within normal limits. Partially visualized lungs are clear. Other: None. IMPRESSION: Asymmetric soft tissue stranding with inflammatory stranding involving the left neck, primarily involving the left submandibular and parapharyngeal spaces, most consistent with infection/cellulitis. No discrete abscess or drainable fluid collection identified. Electronically Signed   By: Rise Mu M.D.   On: 01/19/2018 22:05    EKG: Not performed.   Assessment/Plan   1. Cellulitis left neck  - Developed pain and swelling to left jaw and neck 6 days ago secondary to dental infection; he now presents with increased swelling despite root canal and oral antibiotics  - No fever or leukocytosis; lactate normal  - CT with inflammatory stranding involving left submandibular and parapharyngeal space without fluid collection, bilateral involvement (Ludwig's), or airway compromise  - Blood cultures collected in ED and IV clindamycin given  - Continue clindamycin, follow cultures and clinical course    DVT prophylaxis: Lovenox Code Status: Full  Family Communication: Discussed with patient Consults called: none Admission status: Observation     Briscoe Deutscher, MD [facility] Pager [phone]  If 7PM-7AM, please contact night-coverage [email or web address] Password [email or web address]  01/20/2018, 1:51 AM

## 2018-01-20 NOTE — ED Provider Notes (Signed)
[facility] Provider Note   CSN: [id number] Arrival date & time: 01/19/18  1732     History   Chief Complaint Chief Complaint  Patient presents with  . Neck Pain    HPI Constance HolsterWilliam Croson is a 47 y.o. male.  HPI  This is a 47 year old male with no significant past medical history who presents with progressive left jaw swelling and neck swelling.  Patient reports that he was seen and evaluated on Thursday for tooth pain.  He was put on Augmentin, Decadron, ibuprofen.  He followed up with his endodontist and had a root canal on Monday morning.  He is currently taking Augmentin and Flagyl.  Patient states that he had been doing well.  However, yesterday he woke up from an afternoon nap and noted increased swelling and redness that seemed to migrate down and across the neck.  He denies any significant pain.  He also feels some fullness in his mouth.  He denies any difficulty swallowing or airway issues.  He has not had any fevers.  He followed up at his endodontist yesterday and was told to come to the ER.   History reviewed. No pertinent past medical history.  There are no active problems to display for this patient.   History reviewed. No pertinent surgical history.      Home Medications    Prior to Admission medications   Medication Sig Start Date End Date Taking? Authorizing Provider  amoxicillin-clavulanate (AUGMENTIN) 875-125 MG tablet Take 1 tablet by mouth 2 (two) times daily. for 10 days 01/16/18  Yes [provider]  ibuprofen (ADVIL,MOTRIN) 800 MG tablet Take 800 mg by mouth every 8 (eight) hours as needed for moderate pain.  01/16/18  Yes [provider]  metroNIDAZOLE (FLAGYL) 500 MG tablet Take 500 mg by mouth every 6 (six) hours. 01/18/18  Yes [provider]  HYDROcodone-acetaminophen (NORCO) 5-325 MG per tablet Take 1-2 tablets by mouth every 6 (six) hours as needed. Patient not taking: Reported on  01/20/2018 07/27/14   Geoffery Lyonselo, Douglas, MD    Family History History reviewed. No pertinent family history.  Social History Social History   Tobacco Use  . Smoking status: Never Smoker  . Smokeless tobacco: Never Used  Substance Use Topics  . Alcohol use: Yes  . Drug use: Not on file     Allergies   Patient has no known allergies.   Review of Systems Review of Systems  Constitutional: Negative for fever.  HENT: Positive for dental problem. Negative for sore throat and trouble swallowing.   Respiratory: Negative for shortness of breath.   Cardiovascular: Negative for chest pain.  Gastrointestinal: Negative for nausea and vomiting.  Musculoskeletal: Negative for neck pain.  Skin: Positive for color change.  All other systems reviewed and are negative.    Physical Exam Updated Vital Signs BP (!) 162/104 (BP Location: Right Arm)   Pulse 72   Temp 98.5 F (36.9 C) (Oral)   Resp 18   Ht 6' (1.829 m)   Wt 88.5 kg (195 lb)   SpO2 97%   BMI 26.45 kg/m   Physical Exam  Constitutional: He is oriented to person, place, and time. He appears well-developed and well-nourished.  Well-appearing, ABCs intact  HENT:  Head: Normocephalic and atraumatic.  Slight fullness noted under the tongue/ligual area Site of root canal without any immediate complications  Neck: Neck supple. No tracheal deviation present.  Slight tenderness to palpation with fullness in the left  submental region, blanching erythema extending medially  Cardiovascular: Normal rate, regular rhythm and normal heart sounds.  No murmur heard. Pulmonary/Chest: Effort normal and breath sounds normal. No respiratory distress. He has no wheezes.  Musculoskeletal: He exhibits no edema.  Lymphadenopathy:    He has no cervical adenopathy.  Neurological: He is alert and oriented to person, place, and time.  Skin: Skin is warm and dry.  Psychiatric: He has a normal mood and affect.  Nursing note and vitals  reviewed.    ED Treatments / Results  Labs (all labs ordered are listed, but only abnormal results are displayed) Labs Reviewed  CULTURE, BLOOD (ROUTINE X 2)  CULTURE, BLOOD (ROUTINE X 2)  CBC WITH DIFFERENTIAL/PLATELET  BASIC METABOLIC PANEL  I-STAT CG4 LACTIC ACID, ED  I-STAT CG4 LACTIC ACID, ED    EKG None  Radiology Ct Soft Tissue Neck W Contrast  Result Date: 01/19/2018 CLINICAL DATA:  Initial evaluation for acute left-sided neck swelling, recent dental surgery. EXAM: CT NECK WITH CONTRAST TECHNIQUE: Multidetector CT imaging of the neck was performed using the standard protocol following the bolus administration of intravenous contrast. CONTRAST:  75mL ISOVUE-300 IOPAMIDOL (ISOVUE-300) INJECTION 61% COMPARISON:  None. FINDINGS: Pharynx and larynx: Oral cavity within normal limits without mass lesion or loculated fluid collection. There is asymmetric soft tissue swelling with inflammatory stranding adjacent to the left mandibular body, with additional swelling and inflammatory stranding within the overlying left masticator and submandibular spaces. Findings concerning for possible infection/cellulitis. Asymmetric thickening of the left platysmas. Inflammatory stranding extends inferiorly down the anterior aspect of the left and central neck. Extension into the left parapharyngeal space with mild induration within the left parapharyngeal fat. No discrete abscess or drainable fluid collection identified. Oropharynx within normal limits. Palatine tonsils symmetric and within normal limits. Nasopharynx normal. No retropharyngeal collection. Minimal asymmetric edema at the left epiglottis/aryepiglottic folds related to the inflammatory process within the left neck. Remainder of the hypopharynx and supraglottic larynx within normal limits. True cords symmetric and normal. Subglottic airway clear. Salivary glands: Parotid glands within normal limits. Inflammatory stranding surrounds the left  submandibular gland while the gland itself is normal in appearance. Right submandibular gland normal. Thyroid: Approximately 1 cm hypodense nodule noted within the inferior left thyroid lobe, of doubtful significance. Thyroid otherwise unremarkable. Lymph nodes: Mild asymmetric prominence of nonenlarged left level IB nodes, likely reactive. No pathologically enlarged lymph nodes identified within the neck. Vascular: Normal intravascular enhancement seen throughout the neck. Small focus of gas within the left IJ likely related IV access/injection. Limited intracranial: Unremarkable. Visualized orbits: Visualized globes and orbital soft tissues within normal limits. Mastoids and visualized paranasal sinuses: Visualized paranasal sinuses are clear. Mastoids and middle ear cavities are clear. Skeleton: No acute osseous abnormality. No worrisome lytic or blastic osseous lesions. Mild degenerate spondylolysis noted at C5-6 and C6-7. Upper chest: Visualized upper chest within normal limits. Partially visualized lungs are clear. Other: None. IMPRESSION: Asymmetric soft tissue stranding with inflammatory stranding involving the left neck, primarily involving the left submandibular and parapharyngeal spaces, most consistent with infection/cellulitis. No discrete abscess or drainable fluid collection identified. Electronically Signed   By: Rise Mu M.D.   On: 01/19/2018 22:05    Procedures Procedures (including critical care time)  Medications Ordered in ED Medications  iopamidol (ISOVUE-300) 61 % injection (has no administration in time range)  clindamycin (CLEOCIN) IVPB 600 mg (has no administration in time range)  iopamidol (ISOVUE-300) 61 % injection 75 mL (75 mLs Intravenous Contrast Given  01/19/18 2126)     Initial Impression / Assessment and Plan / ED Course  I have reviewed the triage vital signs and the nursing notes.  Pertinent labs & imaging results that were available during my care of  the patient were reviewed by me and considered in my medical decision making (see chart for details).     Patient presents with worsening swelling and redness over the neck post root canal.  He has some slight fullness under the tongue.  He is nontoxic-appearing and vital signs are reassuring.  ABCs are intact.  No significant leukocytosis.  Concern would be for possible Ludwig's following a dental procedure.  CT scan only showed cellulitis of the neck.  While this is reassuring, patient is on appropriate oral flora antibiotics of Augmentin and Flagyl.  Would be concerned with progression of cellulitis while on what appears to be appropriate antibiotics.  Patient was given IV clindamycin.  Blood cultures obtained.  Will admit for observation, antibiotics to ensure no further progression.  This was discussed with Dr. Antionette Char.    Final Clinical Impressions(s) / ED Diagnoses   Final diagnoses:  Cellulitis, neck    ED Discharge Orders    None       Shon Baton, MD 01/20/18 [phone]

## 2018-01-21 DIAGNOSIS — L03221 Cellulitis of neck: Secondary | ICD-10-CM | POA: Diagnosis not present

## 2018-01-21 LAB — BASIC METABOLIC PANEL
Anion gap: 9 (ref 5–15)
BUN: 9 mg/dL (ref 6–20)
CHLORIDE: 104 mmol/L (ref 101–111)
CO2: 26 mmol/L (ref 22–32)
Calcium: 8.7 mg/dL — ABNORMAL LOW (ref 8.9–10.3)
Creatinine, Ser: 1.12 mg/dL (ref 0.61–1.24)
GFR calc non Af Amer: >60 mL/min (ref >60)
Glucose, Bld: 103 mg/dL — ABNORMAL HIGH (ref 65–99)
POTASSIUM: 4.2 mmol/L (ref 3.5–5.1)
SODIUM: 139 mmol/L (ref 135–145)

## 2018-01-21 LAB — CBC
HEMATOCRIT: 43.4 % (ref 39.0–52.0)
HEMOGLOBIN: 14.2 g/dL (ref 13.0–17.0)
MCH: 28.2 pg (ref 26.0–34.0)
MCHC: 32.7 g/dL (ref 30.0–36.0)
MCV: 86.3 fL (ref 78.0–100.0)
Platelets: 219 10*3/uL (ref 150–400)
RBC: 5.03 MIL/uL (ref 4.22–5.81)
RDW: 12.7 % (ref 11.5–15.5)
WBC: 8.1 10*3/uL (ref 4.0–10.5)

## 2018-01-21 LAB — MRSA PCR SCREENING: MRSA BY PCR: NEGATIVE

## 2018-01-21 MED ORDER — CLINDAMYCIN HCL 150 MG PO CAPS: 450.0000 mg | ORAL_CAPSULE | Freq: Four times a day (QID) | ORAL | 0 refills | Status: AC

## 2018-01-21 NOTE — Discharge Summary (Signed)
Physician Discharge Summary  Zachary York ZOX:[id number] DOB: 20-Mar-1971 DOA: 01/19/2018  PCP: Merri Brunette, MD  Admit date: 01/19/2018 Discharge date: 01/21/2018  Time spent: 35 minutes  Recommendations for Outpatient Follow-up:  1. Follow up outpatient CBC/CMP 2. Follow up final cultures 3. Ensure follow up with endodontist and completion of course of clinda and continued improvement   Discharge Diagnoses:  Principal Problem:   Cellulitis, neck Active Problems:   Cellulitis of neck   Discharge Condition: stable  Diet recommendation: heart healthy  Filed Weights   01/19/18 1940  Weight: 88.5 kg (195 lb)    History of present illness:  47 y.o.malewho typically enjoys good health, now presenting to the emergency department for evaluation of pain, swelling, and redness involving the left jaw and neck with imaging findings c/w cellulitis.   He improved on IV clindamycin.  He was discharged with PO clindamycin with plans to follow up with endodontist as outpatient. Given strict return precautions.  Hospital Course:   L neck cellulitis: Presented with increased redness, swelling and pain despite abx and root canal on Monday.   Augmentin since Saturday (4/20) and flagyl added Monday (4/22).  Since starting clindamycin, pt notes subjective improvement in pain and swelling.  Seems less tender on exam as well.  - CT with asymmetric soft tissue stranding with inflammatory stranding involving L neck involving the L submandibular and parapharyngeal spaces c/w infection/celllitis (no descrete abscess or drainable fluid collection) - Afebrile, no leukocytosis - Clindamycin on d/c - follow up with PCP and endodontist - strict return precautions given - follow blood cx, MRSA pcr negative  Procedures:  none   Consultations:  none  Discharge Exam: Vitals:   01/20/18 2208 01/21/18 0541  BP: (!) 164/108 (!) 141/100  Pulse: 72 75  Resp:    Temp: 98.5 F (36.9 C) 97.6 F (36.4  C)  SpO2: 98% 99%   Feeling better. Feels like swelling is down. Feels less with swallowing.  Pain is better.  General: No acute distress. HEENT  NECK:  TTP, swelling and redness to L neck.  Submandibular region.  Seems improved.  TTP under tongue on L, but soft.  Seems improved.  ~2 finger trismus, stable. Cardiovascular: Heart sounds show a regular rate, and rhythm. No gallops or rubs. No murmurs. No JVD. Lungs: Clear to auscultation bilaterally with good air movement. No rales, rhonchi or wheezes. Abdomen: Soft, nontender, nondistended with normal active bowel sounds. No masses. No hepatosplenomegaly. Neurological: Alert and oriented 3. Moves all extremities 4. Cranial nerves II through XII grossly intact. Skin: Warm and dry. No rashes or lesions. Extremities: No clubbing or cyanosis. No edema. Pedal pulses 2+. Psychiatric: Mood and affect are normal. Insight and judgment are appropriate.  Discharge Instructions   Discharge Instructions    Call MD for:  difficulty breathing, headache or visual disturbances   Complete by:  As directed    Call MD for:  persistant dizziness or light-headedness   Complete by:  As directed    Call MD for:  persistant nausea and vomiting   Complete by:  As directed    Call MD for:  redness, tenderness, or signs of infection (pain, swelling, redness, odor or green/yellow discharge around incision site)   Complete by:  As directed    Call MD for:  severe uncontrolled pain   Complete by:  As directed    Call MD for:  temperature >100.4   Complete by:  As directed    Diet - low sodium  heart healthy   Complete by:  As directed    Discharge instructions   Complete by:  As directed    You were seen for cellulitis of the neck.  You've improved with IV clindamycin.  We will discharge you with this antibiotic for an additional 13 days.  Please take this as prescribed.  Please call to see if you can get an earlier appointment with your endodontist  (possibly tomorrow if possible).  Otherwise, keep your scheduled appointment on Tuesday.  Return for any worsening symptoms.   Please follow up with your PCP in a few days to follow up labs and your hospitalization.   Please ask your PCP to request records from this hospitalization so they know what was done and what the next steps will be.   Increase activity slowly   Complete by:  As directed      Allergies as of 01/21/2018   No Known Allergies     Medication List    STOP taking these medications   amoxicillin-clavulanate 875-125 MG tablet Commonly known as:  AUGMENTIN   metroNIDAZOLE 500 MG tablet Commonly known as:  FLAGYL     TAKE these medications   clindamycin 150 MG capsule Commonly known as:  CLEOCIN Take 3 capsules (450 mg total) by mouth 4 (four) times daily for 13 days.   ibuprofen 800 MG tablet Commonly known as:  ADVIL,MOTRIN Take 800 mg by mouth every 8 (eight) hours as needed for moderate pain.      No Known Allergies    The results of significant diagnostics from this hospitalization (including imaging, microbiology, ancillary and laboratory) are listed below for reference.    Significant Diagnostic Studies: Ct Soft Tissue Neck W Contrast  Result Date: 01/19/2018 CLINICAL DATA:  Initial evaluation for acute left-sided neck swelling, recent dental surgery. EXAM: CT NECK WITH CONTRAST TECHNIQUE: Multidetector CT imaging of the neck was performed using the standard protocol following the bolus administration of intravenous contrast. CONTRAST:  75mL ISOVUE-300 IOPAMIDOL (ISOVUE-300) INJECTION 61% COMPARISON:  None. FINDINGS: Pharynx and larynx: Oral cavity within normal limits without mass lesion or loculated fluid collection. There is asymmetric soft tissue swelling with inflammatory stranding adjacent to the left mandibular body, with additional swelling and inflammatory stranding within the overlying left masticator and submandibular spaces. Findings  concerning for possible infection/cellulitis. Asymmetric thickening of the left platysmas. Inflammatory stranding extends inferiorly down the anterior aspect of the left and central neck. Extension into the left parapharyngeal space with mild induration within the left parapharyngeal fat. No discrete abscess or drainable fluid collection identified. Oropharynx within normal limits. Palatine tonsils symmetric and within normal limits. Nasopharynx normal. No retropharyngeal collection. Minimal asymmetric edema at the left epiglottis/aryepiglottic folds related to the inflammatory process within the left neck. Remainder of the hypopharynx and supraglottic larynx within normal limits. True cords symmetric and normal. Subglottic airway clear. Salivary glands: Parotid glands within normal limits. Inflammatory stranding surrounds the left submandibular gland while the gland itself is normal in appearance. Right submandibular gland normal. Thyroid: Approximately 1 cm hypodense nodule noted within the inferior left thyroid lobe, of doubtful significance. Thyroid otherwise unremarkable. Lymph nodes: Mild asymmetric prominence of nonenlarged left level IB nodes, likely reactive. No pathologically enlarged lymph nodes identified within the neck. Vascular: Normal intravascular enhancement seen throughout the neck. Small focus of gas within the left IJ likely related IV access/injection. Limited intracranial: Unremarkable. Visualized orbits: Visualized globes and orbital soft tissues within normal limits. Mastoids and visualized paranasal sinuses: Visualized  paranasal sinuses are clear. Mastoids and middle ear cavities are clear. Skeleton: No acute osseous abnormality. No worrisome lytic or blastic osseous lesions. Mild degenerate spondylolysis noted at C5-6 and C6-7. Upper chest: Visualized upper chest within normal limits. Partially visualized lungs are clear. Other: None. IMPRESSION: Asymmetric soft tissue stranding with  inflammatory stranding involving the left neck, primarily involving the left submandibular and parapharyngeal spaces, most consistent with infection/cellulitis. No discrete abscess or drainable fluid collection identified. Electronically Signed   By: Rise MuBenjamin  McClintock M.D.   On: 01/19/2018 22:05    Microbiology: Recent Results (from the past 240 hour(s))  Blood culture (routine x 2)     Status: None (Preliminary result)   Collection Time: 01/20/18  1:10 AM  Result Value Ref Range Status   Specimen Description BLOOD RIGHT ANTECUBITAL  Final   Special Requests   Final    BOTTLES DRAWN AEROBIC AND ANAEROBIC Blood Culture adequate volume   Culture   Final    NO GROWTH 1 DAY Performed at [facility], [address] [place], [place] [address]    Report Status PENDING  Incomplete  Blood culture (routine x 2)     Status: None (Preliminary result)   Collection Time: 01/20/18  1:22 AM  Result Value Ref Range Status   Specimen Description BLOOD RIGHT ARM  Final   Special Requests   Final    BOTTLES DRAWN AEROBIC AND ANAEROBIC Blood Culture adequate volume   Culture   Final    NO GROWTH 1 DAY Performed at [facility], [address] [place], [place] [address]    Report Status PENDING  Incomplete  MRSA PCR Screening     Status: None   Collection Time: 01/20/18 10:33 AM  Result Value Ref Range Status   MRSA by PCR NEGATIVE NEGATIVE Final    Comment:        The GeneXpert MRSA Assay (FDA approved for NASAL specimens only), is one component of a comprehensive MRSA colonization surveillance program. It is not intended to diagnose MRSA infection nor to guide or monitor treatment for MRSA infections. Performed at [facility], [address] [place], [place] [address]      Labs: Basic Metabolic Panel: Recent Labs  Lab 01/19/18 2005 01/21/18 0550  NA 136 139  K 3.8 4.2  CL 101 104  CO2 25 26  GLUCOSE 91 103*  BUN 10 9  CREATININE 1.03 1.12  CALCIUM 8.9  8.7*   Liver Function Tests: No results for input(s): AST, ALT, ALKPHOS, BILITOT, PROT, ALBUMIN in the last 168 hours. No results for input(s): LIPASE, AMYLASE in the last 168 hours. No results for input(s): AMMONIA in the last 168 hours. CBC: Recent Labs  Lab 01/19/18 2005 01/21/18 0550  WBC 8.8 8.1  NEUTROABS 5.3  --   HGB 14.4 14.2  HCT 43.3 43.4  MCV 86.8 86.3  PLT 219 219   Cardiac Enzymes: No results for input(s): CKTOTAL, CKMB, CKMBINDEX, TROPONINI in the last 168 hours. BNP: BNP (last 3 results) No results for input(s): BNP in the last 8760 hours.  ProBNP (last 3 results) No results for input(s): PROBNP in the last 8760 hours.  CBG: No results for input(s): GLUCAP in the last 168 hours.     Signed:  Lacretia Nicksaldwell Powell MD.  [facility] 01/21/2018, 8:52 PM

## 2018-01-25 LAB — CULTURE, BLOOD (ROUTINE X 2)
CULTURE: NO GROWTH
CULTURE: NO GROWTH
Special Requests: ADEQUATE
Special Requests: ADEQUATE

## 2018-03-11 DIAGNOSIS — H903 Sensorineural hearing loss, bilateral: Secondary | ICD-10-CM | POA: Diagnosis not present

## 2018-09-01 DIAGNOSIS — H6092 Unspecified otitis externa, left ear: Secondary | ICD-10-CM | POA: Diagnosis not present

## 2018-11-15 DIAGNOSIS — J029 Acute pharyngitis, unspecified: Secondary | ICD-10-CM | POA: Diagnosis not present

## 2019-11-26 ENCOUNTER — Emergency Department (HOSPITAL_BASED_OUTPATIENT_CLINIC_OR_DEPARTMENT_OTHER): Payer: Commercial Managed Care - PPO

## 2019-11-26 ENCOUNTER — Emergency Department (HOSPITAL_BASED_OUTPATIENT_CLINIC_OR_DEPARTMENT_OTHER)
Admission: EM | Admit: 2019-11-26 | Discharge: 2019-11-26 | Disposition: A | Discharge status: Home / Self Care | Payer: Commercial Managed Care - PPO | Attending: Emergency Medicine | Admitting: Emergency Medicine

## 2019-11-26 ENCOUNTER — Other Ambulatory Visit: Payer: Self-pay

## 2019-11-26 ENCOUNTER — Encounter (HOSPITAL_BASED_OUTPATIENT_CLINIC_OR_DEPARTMENT_OTHER): Payer: Self-pay | Admitting: Emergency Medicine

## 2019-11-26 DIAGNOSIS — Y939 Activity, unspecified: Secondary | ICD-10-CM | POA: Insufficient documentation

## 2019-11-26 DIAGNOSIS — W293XXA Contact with powered garden and outdoor hand tools and machinery, initial encounter: Secondary | ICD-10-CM | POA: Diagnosis not present

## 2019-11-26 DIAGNOSIS — Y929 Unspecified place or not applicable: Secondary | ICD-10-CM | POA: Insufficient documentation

## 2019-11-26 DIAGNOSIS — S91112A Laceration without foreign body of left great toe without damage to nail, initial encounter: Secondary | ICD-10-CM | POA: Insufficient documentation

## 2019-11-26 DIAGNOSIS — Y999 Unspecified external cause status: Secondary | ICD-10-CM | POA: Diagnosis not present

## 2019-11-26 DIAGNOSIS — Z23 Encounter for immunization: Secondary | ICD-10-CM | POA: Diagnosis not present

## 2019-11-26 MED ORDER — TETANUS-DIPHTH-ACELL PERTUSSIS 5-2.5-18.5 LF-MCG/0.5 IM SUSP
0.5000 mL | Freq: Once | INTRAMUSCULAR | Status: AC
Start: 2019-11-26 — End: 2019-11-26
  Administered 2019-11-26: 18:00:00 0.5 mL via INTRAMUSCULAR
  Filled 2019-11-26: qty 0.5

## 2019-11-26 NOTE — ED Triage Notes (Signed)
Laceration to L great toe from a chain saw. Bleeding controlled.

## 2019-11-26 NOTE — Discharge Instructions (Addendum)
Thank you for allowing me to care for you today. Please return to the emergency department if you have new or worsening symptoms.   

## 2019-11-26 NOTE — ED Provider Notes (Signed)
[facility] EMERGENCY DEPARTMENT Provider Note   CSN: [id number] Arrival date & time: 11/26/19  1701     History Chief Complaint  Patient presents with  . Laceration    Zachary York is a 50 y.o. male.  Patient is a 49 year old gentleman with no past medical history presenting to the emergency department for left big toe laceration which occurred around 2:30 PM this afternoon.  Patient reports that he was using a chainsaw outside and accidentally cut through his shoe onto his toe.  He thinks his tetanus shot might have been 6 years ago but it could have been longer.  Denies any numbness, tingling, weakness or anticoagulation        Past Medical History:  Diagnosis Date  . Cellulitis 12/2017   neck    Patient Active Problem List   Diagnosis Date Noted  . Cellulitis, neck 01/20/2018  . Cellulitis of neck 01/20/2018    Past Surgical History:  Procedure Laterality Date  . WISDOM TOOTH EXTRACTION         No family history on file.  Social History   Tobacco Use  . Smoking status: Never Smoker  . Smokeless tobacco: Never Used  Substance Use Topics  . Alcohol use: Yes  . Drug use: Never    Home Medications Prior to Admission medications   Medication Sig Start Date End Date Taking? Authorizing Provider  ibuprofen (ADVIL,MOTRIN) 800 MG tablet Take 800 mg by mouth every 8 (eight) hours as needed for moderate pain.  01/16/18   [provider]    Allergies    Patient has no known allergies.  Review of Systems   Review of Systems  Constitutional: Negative for chills and fever.  Skin: Positive for wound.  Allergic/Immunologic: Negative for immunocompromised state.  Neurological: Negative for numbness.  Hematological: Does not bruise/bleed easily.    Physical Exam Updated Vital Signs BP (!) 157/91 (BP Location: Left Arm)   Pulse 81   Temp 98.1 F (36.7 C) (Oral)   Resp 18   Ht 6' (1.829 m)   Wt 88.5 kg   SpO2 100%   BMI 26.45 kg/m     Physical Exam Vitals and nursing note reviewed.  Constitutional:      General: He is not in acute distress.    Appearance: Normal appearance. He is not ill-appearing, toxic-appearing or diaphoretic.  HENT:     Head: Normocephalic.  Eyes:     Conjunctiva/sclera: Conjunctivae normal.  Pulmonary:     Effort: Pulmonary effort is normal.  Musculoskeletal:     Comments: Dorsum of the left big toe with 2 cm irregular laceration.  Normal strength, sensation, capillary refill and range of motion of the big toe.  Skin:    General: Skin is dry.     Capillary Refill: Capillary refill takes less than 2 seconds.  Neurological:     Mental Status: He is alert.  Psychiatric:        Mood and Affect: Mood normal.     ED Results / Procedures / Treatments   Labs (all labs ordered are listed, but only abnormal results are displayed) Labs Reviewed - No data to display  EKG None  Radiology DG Toe Great Left  Result Date: 11/26/2019 CLINICAL DATA:  Laceration to anterior great toe. EXAM: LEFT GREAT TOE COMPARISON:  None. FINDINGS: No acute fracture or dislocation.  No radiopaque foreign object. IMPRESSION: No acute osseous abnormality. Electronically Signed   By: Jeronimo Greaves M.D.   On: 11/26/2019  17:46    Procedures .Marland KitchenLaceration Repair  Date/Time: 11/26/2019 6:00 PM Performed by: Alveria Apley, PA-C Authorized by: Alveria Apley, PA-C   Consent:    Consent obtained:  Verbal   Consent given by:  Patient   Risks discussed:  Infection, pain, poor cosmetic result, poor wound healing, need for additional repair, tendon damage and vascular damage   Alternatives discussed:  No treatment and referral Anesthesia (see MAR for exact dosages):    Anesthesia method:  None Laceration details:    Location:  Toe   Toe location:  L big toe   Length (cm):  2   Depth (mm):  0.5 Repair type:    Repair type:  Simple Pre-procedure details:    Preparation:  Patient was prepped and draped in usual  sterile fashion Exploration:    Hemostasis achieved with:  Direct pressure   Wound exploration: wound explored through full range of motion     Wound extent: no areolar tissue violation noted, no fascia violation noted, no foreign bodies/material noted, no muscle damage noted, no nerve damage noted, no tendon damage noted, no underlying fracture noted and no vascular damage noted   Treatment:    Area cleansed with:  Betadine and soap and water   Amount of cleaning:  Extensive   Irrigation solution:  Sterile saline Approximation:    Approximation:  Close Post-procedure details:    Dressing:  Non-adherent dressing   Patient tolerance of procedure:  Tolerated well, no immediate complications   (including critical care time)  Medications Ordered in ED Medications  Tdap (BOOSTRIX) injection 0.5 mL (0.5 mLs Intramuscular Given 11/26/19 1736)    ED Course  I have reviewed the triage vital signs and the nursing notes.  Pertinent labs & imaging results that were available during my care of the patient were reviewed by me and considered in my medical decision making (see chart for details).  Clinical Course as of Nov 25 1799  Sat Nov 26, 2019  1758 Patient with small laceration to dorsum of the L big toe from chain saw.Repaired with dermabond after extensive cleaning.No bony injury or tendinous injry. Tetanus updated. Advised on return precautions    [KM]    Clinical Course User Index [KM] Kristine Royal   MDM Rules/Calculators/A&P                      Based on review of vitals, medical screening exam, lab work and/or imaging, there does not appear to be an acute, emergent etiology for the patient's symptoms. Counseled pt on good return precautions and encouraged both PCP and ED follow-up as needed.  Prior to discharge, I also discussed incidental imaging findings with patient in detail and advised appropriate, recommended follow-up in detail.  Clinical Impression: No diagnosis  found.  Disposition: Discharge  Prior to providing a prescription for a controlled substance, I independently reviewed the patient's recent prescription history on the [place]. The patient had no recent or regular prescriptions and was deemed appropriate for a brief, less than 3 day prescription of narcotic for acute analgesia.  This note was prepared with assistance of Systems analyst. Occasional wrong-word or sound-a-like substitutions may have occurred due to the inherent limitations of voice recognition software.  Final Clinical Impression(s) / ED Diagnoses Final diagnoses:  None    Rx / DC Orders ED Discharge Orders    None       Alveria Apley,  PA-C 11/26/19 1809    Zachary Sleeper, MD 11/26/19 2133

## 2021-04-15 ENCOUNTER — Other Ambulatory Visit: Payer: Self-pay

## 2021-04-15 ENCOUNTER — Emergency Department (HOSPITAL_BASED_OUTPATIENT_CLINIC_OR_DEPARTMENT_OTHER)
Admission: EM | Admit: 2021-04-15 | Discharge: 2021-04-16 | Disposition: A | Discharge status: Home / Self Care | Payer: Commercial Managed Care - PPO | Attending: Emergency Medicine | Admitting: Emergency Medicine

## 2021-04-15 ENCOUNTER — Encounter (HOSPITAL_BASED_OUTPATIENT_CLINIC_OR_DEPARTMENT_OTHER): Payer: Self-pay | Admitting: *Deleted

## 2021-04-15 ENCOUNTER — Emergency Department (HOSPITAL_BASED_OUTPATIENT_CLINIC_OR_DEPARTMENT_OTHER): Payer: Commercial Managed Care - PPO

## 2021-04-15 DIAGNOSIS — R072 Precordial pain: Secondary | ICD-10-CM

## 2021-04-15 DIAGNOSIS — R079 Chest pain, unspecified: Secondary | ICD-10-CM | POA: Diagnosis not present

## 2021-04-15 DIAGNOSIS — R030 Elevated blood-pressure reading, without diagnosis of hypertension: Secondary | ICD-10-CM

## 2021-04-15 LAB — BASIC METABOLIC PANEL
Anion gap: 7 (ref 5–15)
BUN: 12 mg/dL (ref 6–20)
CO2: 27 mmol/L (ref 22–32)
Calcium: 8.5 mg/dL — ABNORMAL LOW (ref 8.9–10.3)
Chloride: 105 mmol/L (ref 98–111)
Creatinine, Ser: 1.06 mg/dL (ref 0.61–1.24)
GFR, Estimated: >60 mL/min (ref >60)
Glucose, Bld: 131 mg/dL — ABNORMAL HIGH (ref 70–99)
Potassium: 3.5 mmol/L (ref 3.5–5.1)
Sodium: 139 mmol/L (ref 135–145)

## 2021-04-15 LAB — CBC
HCT: 38.9 % — ABNORMAL LOW (ref 39.0–52.0)
Hemoglobin: 12.7 g/dL — ABNORMAL LOW (ref 13.0–17.0)
MCH: 26.8 pg (ref 26.0–34.0)
MCHC: 32.6 g/dL (ref 30.0–36.0)
MCV: 82.1 fL (ref 80.0–100.0)
Platelets: 295 10*3/uL (ref 150–400)
RBC: 4.74 MIL/uL (ref 4.22–5.81)
RDW: 12 % (ref 11.5–15.5)
WBC: 8.2 10*3/uL (ref 4.0–10.5)
nRBC: 0 % (ref 0.0–0.2)

## 2021-04-15 LAB — TROPONIN I (HIGH SENSITIVITY): Troponin I (High Sensitivity): 4 ng/L (ref <18)

## 2021-04-15 MED ORDER — ALUM & MAG HYDROXIDE-SIMETH 200-200-20 MG/5ML PO SUSP
30.0000 mL | Freq: Once | ORAL | Status: AC
  Administered 2021-04-15: 30 mL via ORAL
  Filled 2021-04-15: qty 30

## 2021-04-15 MED ORDER — FAMOTIDINE 20 MG PO TABS
20.0000 mg | ORAL_TABLET | Freq: Once | ORAL | Status: DC
Start: 2021-04-15 — End: 2021-04-16
  Filled 2021-04-15: qty 1

## 2021-04-15 MED ORDER — ACETAMINOPHEN 500 MG PO TABS
1000.0000 mg | ORAL_TABLET | Freq: Once | ORAL | Status: AC
  Administered 2021-04-15: 1000 mg via ORAL
  Filled 2021-04-15: qty 2

## 2021-04-15 NOTE — ED Triage Notes (Signed)
C/o left sided chest pain , h/a and SOB with exertion x 3 days

## 2021-04-15 NOTE — Discharge Instructions (Addendum)
It was our pleasure to provide your ER care today - we hope that you feel better.  Follow up with your doctor tomorrow as planned - discuss stress testing and/or referral to cardiologist  then. Also have your blood pressure rechecked then, as it is high today.   Return to ER if worse, new symptoms, high fevers, recurrent/persistent chest pain, increased  trouble breathing, or other concern.

## 2021-04-15 NOTE — ED Notes (Signed)
Patient transported to X-ray 

## 2021-04-15 NOTE — ED Provider Notes (Signed)
[facility] EMERGENCY DEPARTMENT Provider Note   CSN: [id number] Arrival date & time: 04/15/21  2031     History Chief Complaint  Patient presents with   Chest Pain    Zachary York is a 50 y.o. male.  Patient c/o mid chest pain in past few days. Symptoms at rest, esp at night, constant, dull, non radiating. No nv, diaphoresis. ?mild doe. No pleuritic pain. No cough or uri symptoms. No fever or chills. No chest wall injury or strain. ?reflux symptoms. Denies exertional chest pain. Has appt tomorrow AM w pcp to discuss possible stress testing. No hx cad. Grandparent had cad/mi. Had mild frontal headache earlier, currently resolved.   The history is provided by the patient.  Chest Pain Associated symptoms: no abdominal pain, no back pain, no cough, no fever, no palpitations and no vomiting       Past Medical History:  Diagnosis Date   Cellulitis 12/2017   neck    Patient Active Problem List   Diagnosis Date Noted   Cellulitis, neck 01/20/2018   Cellulitis of neck 01/20/2018    Past Surgical History:  Procedure Laterality Date   WISDOM TOOTH EXTRACTION         No family history on file.  Social History   Tobacco Use   Smoking status: Never   Smokeless tobacco: Never  Vaping Use   Vaping Use: Never used  Substance Use Topics   Alcohol use: Yes   Drug use: Never    Home Medications Prior to Admission medications   Medication Sig Start Date End Date Taking? Authorizing Provider  ibuprofen (ADVIL,MOTRIN) 800 MG tablet Take 800 mg by mouth every 8 (eight) hours as needed for moderate pain.  01/16/18   [provider]    Allergies    Patient has no known allergies.  Review of Systems   Review of Systems  Constitutional:  Negative for fever.  HENT:  Negative for sore throat.   Eyes:  Negative for redness.  Respiratory:  Negative for cough.   Cardiovascular:  Positive for chest pain. Negative for palpitations and leg swelling.   Gastrointestinal:  Negative for abdominal pain and vomiting.  Genitourinary:  Negative for flank pain.  Musculoskeletal:  Negative for back pain and neck pain.  Skin:  Negative for rash.  Neurological:  Negative for syncope.  Hematological:  Does not bruise/bleed easily.  Psychiatric/Behavioral:  Negative for confusion.    Physical Exam Updated Vital Signs BP (!) 164/101 (BP Location: Right Arm)   Pulse 78   Temp 97.9 F (36.6 C) (Oral)   Resp 16   Ht 1.829 m (6')   Wt 90.7 kg   SpO2 98%   BMI 27.12 kg/m   Physical Exam Vitals and nursing note reviewed.  Constitutional:      Appearance: Normal appearance. He is well-developed.  HENT:     Head: Atraumatic.     Nose: Nose normal.     Mouth/Throat:     Mouth: Mucous membranes are moist.  Eyes:     General: No scleral icterus.    Conjunctiva/sclera: Conjunctivae normal.     Pupils: Pupils are equal, round, and reactive to light.  Neck:     Trachea: No tracheal deviation.  Cardiovascular:     Rate and Rhythm: Normal rate and regular rhythm.     Pulses: Normal pulses.     Heart sounds: Normal heart sounds. No murmur heard.   No friction rub. No gallop.  Pulmonary:  Effort: Pulmonary effort is normal. No accessory muscle usage or respiratory distress.     Breath sounds: Normal breath sounds.  Chest:     Chest wall: No tenderness.  Abdominal:     General: There is no distension.     Palpations: Abdomen is soft.     Tenderness: There is no abdominal tenderness.  Genitourinary:    Comments: No cva tenderness. Musculoskeletal:        General: No swelling or tenderness.     Cervical back: Normal range of motion and neck supple. No rigidity.     Right lower leg: No edema.     Left lower leg: No edema.  Skin:    General: Skin is warm and dry.     Findings: No rash.  Neurological:     Mental Status: He is alert.     Comments: Alert, speech clear.   Psychiatric:        Mood and Affect: Mood normal.    ED  Results / Procedures / Treatments   Labs (all labs ordered are listed, but only abnormal results are displayed) Results for orders placed or performed during the hospital encounter of 04/15/21  Basic metabolic panel  Result Value Ref Range   Sodium 139 135 - 145 mmol/L   Potassium 3.5 3.5 - 5.1 mmol/L   Chloride 105 98 - 111 mmol/L   CO2 27 22 - 32 mmol/L   Glucose, Bld 131 (H) 70 - 99 mg/dL   BUN 12 6 - 20 mg/dL   Creatinine, Ser 9.44 0.61 - 1.24 mg/dL   Calcium 8.5 (L) 8.9 - 10.3 mg/dL   GFR, Estimated >96 >75 mL/min   Anion gap 7 5 - 15  CBC  Result Value Ref Range   WBC 8.2 4.0 - 10.5 K/uL   RBC 4.74 4.22 - 5.81 MIL/uL   Hemoglobin 12.7 (L) 13.0 - 17.0 g/dL   HCT 91.6 (L) 38.4 - 66.5 %   MCV 82.1 80.0 - 100.0 fL   MCH 26.8 26.0 - 34.0 pg   MCHC 32.6 30.0 - 36.0 g/dL   RDW 99.3 57.0 - 17.7 %   Platelets 295 150 - 400 K/uL   nRBC 0.0 0.0 - 0.2 %  Troponin I (High Sensitivity)  Result Value Ref Range   Troponin I (High Sensitivity) 4 <18 ng/L   DG Chest 2 View  Result Date: 04/15/2021 CLINICAL DATA:  Left-sided chest pain, headache, short of breath for 3 days EXAM: CHEST - 2 VIEW COMPARISON:  10/12/2015 FINDINGS: Frontal and lateral views of the chest demonstrate an unremarkable cardiac silhouette. No acute airspace disease, effusion, or pneumothorax. No acute bony abnormalities. IMPRESSION: 1. No acute intrathoracic process. Electronically Signed   By: Sharlet Salina M.D.   On: 04/15/2021 20:53    EKG EKG Interpretation  Date/Time:  Monday April 15 2021 20:39:28 EDT Ventricular Rate:  76 PR Interval:  162 QRS Duration: 92 QT Interval:  364 QTC Calculation: 409 R Axis:   35 Text Interpretation: Normal sinus rhythm Normal ECG Confirmed by Cathren Laine ([id number]) on 04/15/2021 8:58:34 PM  Radiology DG Chest 2 View  Result Date: 04/15/2021 CLINICAL DATA:  Left-sided chest pain, headache, short of breath for 3 days EXAM: CHEST - 2 VIEW COMPARISON:  10/12/2015 FINDINGS:  Frontal and lateral views of the chest demonstrate an unremarkable cardiac silhouette. No acute airspace disease, effusion, or pneumothorax. No acute bony abnormalities. IMPRESSION: 1. No acute intrathoracic process. Electronically Signed   By: Casimiro Needle  Manson Passey M.D.   On: 04/15/2021 20:53    Procedures Procedures   Medications Ordered in ED Medications  famotidine (PEPCID) tablet 20 mg (has no administration in time range)  alum & mag hydroxide-simeth (MAALOX/MYLANTA) 200-200-20 MG/5ML suspension 30 mL (has no administration in time range)  acetaminophen (TYLENOL) tablet 1,000 mg (has no administration in time range)    ED Course  I have reviewed the triage vital signs and the nursing notes.  Pertinent labs & imaging results that were available during my care of the patient were reviewed by me and considered in my medical decision making (see chart for details).    MDM Rules/Calculators/A&P                         Labs. Cxr.   Reviewed nursing notes and prior charts for additional history.   Labs reviewed/interpreted by me - trop normal - after symptoms for past few days, normal trop, felt not c/w acs.   Cxr reviewed/interpreted by me - no pna.   Acetaminophen po, pepcid po, maalox po for symptom relief.   Pt indicates hx borderline high bp in past. Close pcp f/u recommended.   Patient indicates has pcp f/u tomorrow to discuss stress testing.   Return to ER if worse, persistent or recurrent chest pain, trouble breathing, or other concern.    Final Clinical Impression(s) / ED Diagnoses Final diagnoses:  None    Rx / DC Orders ED Discharge Orders     None        Cathren Laine, MD 04/15/21 2312

## 2021-04-18 ENCOUNTER — Telehealth: Payer: Self-pay

## 2021-04-18 NOTE — Telephone Encounter (Signed)
Referral notes sent from [facility] ED, Phone #: [phone], Fax #: [phone]   Notes sent to scheduling

## 2021-04-26 ENCOUNTER — Ambulatory Visit: Payer: Commercial Managed Care - PPO | Admitting: Cardiology

## 2021-04-26 ENCOUNTER — Encounter: Payer: Self-pay | Admitting: Cardiology

## 2021-04-26 ENCOUNTER — Other Ambulatory Visit: Payer: Self-pay

## 2021-04-26 VITALS — BP 135/98 | HR 83 | Temp 98.1°F | Ht 72.0 in | Wt 205.0 lb

## 2021-04-26 DIAGNOSIS — Z8249 Family history of ischemic heart disease and other diseases of the circulatory system: Secondary | ICD-10-CM

## 2021-04-26 DIAGNOSIS — R0789 Other chest pain: Secondary | ICD-10-CM | POA: Insufficient documentation

## 2021-04-26 NOTE — Progress Notes (Signed)
Patient referred by Carol Ada, MD for chest pain  Subjective:   Zachary York, male    DOB: Nov 17, 1970, 50 y.o.   MRN: [id number]   Chief Complaint  Patient presents with   Shortness of Breath   New Patient (Initial Visit)   Chest Pain     HPI  50 y.o. Caucasian male with family h/o early CAD, now with chest pain.  Patient works as a Music therapist.  He stays active at work, does not do any regular exercise since he started having these chest discomfort episodes.  Chest discomfort occurs on left side, stays constant, more noticeable when he is sitting and thinking about it.  He does not necessarily have worsening of his symptoms with physical exertion.  He had a paternal grandfather who died of heart attack at age 23.  He is worried about his risks.  Patient   Past Medical History:  Diagnosis Date   Cellulitis 12/2017   neck     Past Surgical History:  Procedure Laterality Date   WISDOM TOOTH EXTRACTION       Social History   Tobacco Use  Smoking Status Never  Smokeless Tobacco Never    Social History   Substance and Sexual Activity  Alcohol Use Yes     Family History  Problem Relation Age of Onset   Hypertension Mother    Hypertension Father    Heart attack Paternal Grandfather      Current Outpatient Medications on File Prior to Visit  Medication Sig Dispense Refill   ALPRAZolam (XANAX) 0.25 MG tablet Take 0.25 mg by mouth every 8 (eight) hours as needed.     famotidine (PEPCID) 20 MG tablet Take 20 mg by mouth 2 (two) times daily.     ibuprofen (ADVIL,MOTRIN) 800 MG tablet Take 800 mg by mouth every 8 (eight) hours as needed for moderate pain.   0   iron polysaccharides (NIFEREX) 150 MG capsule Take 150 mg by mouth daily.     Multiple Vitamins-Minerals (MULTIVITAMIN WITH MINERALS) tablet Take 1 tablet by mouth daily.     naproxen sodium (ALEVE) 220 MG tablet Take 220 mg by mouth.     No current facility-administered medications on file  prior to visit.    Cardiovascular and other pertinent studies:  EKG 04/26/2021: Sinus rhythm 85 bpm Cannot exclude old anteroseptal infarct   Recent labs: 04/15/2021: Glucose 131, BUN/Cr 12/1.06. EGFR >60. Na/K 139/3.5.  H/H 12.7/38.9. MCV 82. Platelets 295    Review of Systems  Cardiovascular:  Positive for chest pain. Negative for dyspnea on exertion, leg swelling, palpitations and syncope.  Psychiatric/Behavioral:  The patient is nervous/anxious.         Vitals:   04/26/21 1007 04/26/21 1013  BP: (!) 132/91 (!) 135/98  Pulse: 85 83  Temp: 98.1 F (36.7 C)   SpO2: 98% 97%     Body mass index is 27.8 kg/m. Filed Weights   04/26/21 1007  Weight: 205 lb (93 kg)    Objective:   Physical Exam Vitals and nursing note reviewed.  Constitutional:      General: He is not in acute distress. Neck:     Vascular: No JVD.  Cardiovascular:     Rate and Rhythm: Normal rate and regular rhythm.     Heart sounds: Normal heart sounds. No murmur heard. Pulmonary:     Effort: Pulmonary effort is normal.     Breath sounds: Normal breath sounds. No wheezing or rales.  Musculoskeletal:  Right lower leg: No edema.     Left lower leg: No edema.         Assessment & Recommendations:   50 y.o. Caucasian male with family h/o early CAD, now with chest pain.  Atypical chest pain: Low suspicion for obstructive CAD.  Not an exercise 7 stress test, echocardiogram, and CT cardiac scoring given his family history of early CAD. Will also check lipid panel  Elevated blood pressure without diagnosis of hypertension: Suspected to be related to stress.  Monitor for now.  If remains greater than 130/90 mmHg, could consider adding an antihypertensive therapy, in addition to regular exercise and stress management.  Thank you for referring the patient to [place]. Please feel free to contact with any questions.   Nigel Mormon, MD Pager: [phone] Office: [phone]

## 2021-05-23 ENCOUNTER — Other Ambulatory Visit: Payer: Commercial Managed Care - PPO

## 2021-05-30 ENCOUNTER — Ambulatory Visit: Payer: Commercial Managed Care - PPO

## 2021-05-30 ENCOUNTER — Other Ambulatory Visit: Payer: Self-pay

## 2021-05-30 DIAGNOSIS — Z8249 Family history of ischemic heart disease and other diseases of the circulatory system: Secondary | ICD-10-CM

## 2021-05-30 DIAGNOSIS — R0789 Other chest pain: Secondary | ICD-10-CM

## 2021-05-31 ENCOUNTER — Other Ambulatory Visit: Payer: Commercial Managed Care - PPO

## 2021-06-04 ENCOUNTER — Other Ambulatory Visit: Payer: Self-pay

## 2021-06-04 ENCOUNTER — Ambulatory Visit: Payer: Commercial Managed Care - PPO

## 2021-06-04 DIAGNOSIS — Z8249 Family history of ischemic heart disease and other diseases of the circulatory system: Secondary | ICD-10-CM

## 2021-06-04 DIAGNOSIS — R0789 Other chest pain: Secondary | ICD-10-CM

## 2021-06-04 LAB — PCV CARDIAC STRESS TEST
Angina Index: 0
ST Depression (mm): 0 mm

## 2021-06-18 ENCOUNTER — Ambulatory Visit
Admission: RE | Admit: 2021-06-18 | Discharge: 2021-06-18 | Disposition: A | Discharge status: Home / Self Care | Payer: Commercial Managed Care - PPO | Source: Ambulatory Visit | Attending: Cardiology | Admitting: Cardiology

## 2021-06-18 DIAGNOSIS — Z8249 Family history of ischemic heart disease and other diseases of the circulatory system: Secondary | ICD-10-CM

## 2021-06-18 DIAGNOSIS — R0789 Other chest pain: Secondary | ICD-10-CM

## 2021-06-18 NOTE — Progress Notes (Signed)
Attempted to call pt, no answer. Left vm requesting call back.

## 2021-06-18 NOTE — Progress Notes (Signed)
Reviewed tests. Can you see if he can have a follow up to discuss the results please?  Thanks MJP

## 2021-06-20 NOTE — Progress Notes (Signed)
Second attempt to call pt, no answer. Left vm requesting call back.

## 2021-06-21 NOTE — Progress Notes (Signed)
Third attempt to contact pt, no answer. Left vm requesting call back.

## 2021-07-25 ENCOUNTER — Ambulatory Visit: Payer: Commercial Managed Care - PPO | Admitting: Cardiology

## 2021-07-25 ENCOUNTER — Encounter: Payer: Self-pay | Admitting: Cardiology

## 2021-07-25 ENCOUNTER — Other Ambulatory Visit: Payer: Self-pay

## 2021-07-25 VITALS — BP 133/102 | HR 78 | Temp 97.6°F | Resp 16 | Ht 72.0 in | Wt 203.0 lb

## 2021-07-25 DIAGNOSIS — R931 Abnormal findings on diagnostic imaging of heart and coronary circulation: Secondary | ICD-10-CM

## 2021-07-25 DIAGNOSIS — E782 Mixed hyperlipidemia: Secondary | ICD-10-CM

## 2021-07-25 DIAGNOSIS — I10 Essential (primary) hypertension: Secondary | ICD-10-CM

## 2021-07-25 MED ORDER — ROSUVASTATIN CALCIUM 20 MG PO TABS: 20.0000 mg | ORAL_TABLET | Freq: Every day | ORAL | 3 refills | Status: AC

## 2021-07-25 MED ORDER — AMLODIPINE BESYLATE 5 MG PO TABS: 5.0000 mg | ORAL_TABLET | Freq: Every day | ORAL | 3 refills | Status: DC

## 2021-07-25 NOTE — Progress Notes (Signed)
Error

## 2021-07-25 NOTE — Progress Notes (Signed)
  Patient referred by Smith, Candace, MD for chest pain  Subjective:   Zachary York, male    DOB: 03/03/1971, 50 y.o.   MRN: [id number]   Chief Complaint  Patient presents with   Atypical chest pain   Results    Elevated coronary artery calcium score   Follow-up      HPI  50 y.o. Caucasian male with family h/o early CAD, elevated coronary calcium score, chest pain.  Workup showed normal stress test and echocardiogram, but elevated calcium score in 99th percentile, none in left main.   Patient's chest pain itself has resolved with Pepcid.  He has not had any recurrence of chest pain, with ongoing physical activity and exercise.  Blood pressure, especially diastolic blood pressure remains elevated.  Initial consultation HPI 03/2021: Patient works as a financial advisor.  He stays active at work, does not do any regular exercise since he started having these chest discomfort episodes.  Chest discomfort occurs on left side, stays constant, more noticeable when he is sitting and thinking about it.  He does not necessarily have worsening of his symptoms with physical exertion.  He had a paternal grandfather who died of heart attack at age 42.  He is worried about his risks.  Patient   Current Outpatient Medications on File Prior to Visit  Medication Sig Dispense Refill   ALPRAZolam (XANAX) 0.25 MG tablet Take 0.25 mg by mouth every 8 (eight) hours as needed.     famotidine (PEPCID) 20 MG tablet Take 20 mg by mouth 2 (two) times daily.     ibuprofen (ADVIL,MOTRIN) 800 MG tablet Take 800 mg by mouth every 8 (eight) hours as needed for moderate pain.   0   iron polysaccharides (NIFEREX) 150 MG capsule Take 150 mg by mouth daily.     Multiple Vitamins-Minerals (MULTIVITAMIN WITH MINERALS) tablet Take 1 tablet by mouth daily.     naproxen sodium (ALEVE) 220 MG tablet Take 220 mg by mouth.     No current facility-administered medications on file prior to visit.    Cardiovascular and  other pertinent studies:  CT cardiac scoring 06/18/2021: LM: 0 LAD: 346 LCx: 378 RCA: 538   Total Agatston Score: 1,262 99th percentile   Exercise treadmill stress test 06/04/2021: Exercise treadmill stress test performed using Bruce protocol.  Patient reached 10.1 METS, and 104% of age predicted maximum heart rate.  Exercise capacity was good.  No chest pain reported.  Normal heart rate and hemodynamic response. Stress EKG revealed no ischemic changes. Low risk study.    Echocardiogram 05/30/2021: Left ventricle cavity is normal in size. Focal basal septal hypertrophy.  Normal global wall motion. Normal LV systolic function with EF 66%. Normal  diastolic filling pattern.  Mild tricuspid regurgitation. Estimated pulmonary artery systolic pressure  25 mmHg.  EKG 04/26/2021: Sinus rhythm 85 bpm Cannot exclude old anteroseptal infarct   Recent labs: 04/15/2021: Glucose 131, BUN/Cr 12/1.06. EGFR >60. Na/K 139/3.5.  H/H 12.7/38.9. MCV 82. Platelets 295    Review of Systems  Cardiovascular:  Positive for chest pain. Negative for dyspnea on exertion, leg swelling, palpitations and syncope.  Psychiatric/Behavioral:  The patient is nervous/anxious.         Vitals:   07/25/21 0913 07/25/21 0915  BP: (!) 143/99 (!) 133/102  Pulse: 72 78  Resp: 16   Temp: 97.6 F (36.4 C)   SpO2: 96% 97%      Body mass index is 27.53 kg/m. Filed Weights   07/25/21   0913  Weight: 203 lb (92.1 kg)     Objective:   Physical Exam Vitals and nursing note reviewed.  Constitutional:      General: He is not in acute distress. Neck:     Vascular: No JVD.  Cardiovascular:     Rate and Rhythm: Normal rate and regular rhythm.     Heart sounds: Normal heart sounds. No murmur heard. Pulmonary:     Effort: Pulmonary effort is normal.     Breath sounds: Normal breath sounds. No wheezing or rales.  Musculoskeletal:     Right lower leg: No edema.     Left lower leg: No edema.          Assessment & Recommendations:   50 y.o. Caucasian male with family h/o early CAD, elevated coronary calcium score, chest pain.  Atypical chest pain: Now resolved, most likely GI etiology. Stress testing negative for ischemia (05/2021) However, elevated calcium score, see below.  Elevated calcium score: 99th percentile, none in left main. Given his strong family history, and elevated, calcium score, recommend aggressive medical management for risk factor reduction. Will check lipoprotein a and fasting lipid panel. Recommend Crestor 20 mg daily and repeat lipid panel in 3 months. Had a long conversation with the patient regarding pathophysiology of premature CAD and importance of risk factor reduction.  Hypertension: Consistently remains elevated, especially diastolic blood pressure. In addition to heart healthy diet and exercise, low-salt diet, recommend amlodipine 5 mg daily.  Repeat lipid panel in 3 months F/u in 3 months    Manish J Patwardhan, MD Pager: [phone] Office: [phone]  

## 2021-08-27 IMAGING — DX DG CHEST 2V
2 series · 2 of 2 positions shown · non-contrast
Comparison: 10/12/2015

CLINICAL DATA: Left-sided chest pain, headache, short of breath for
3 days

EXAM:
CHEST - 2 VIEW

[chest pa]
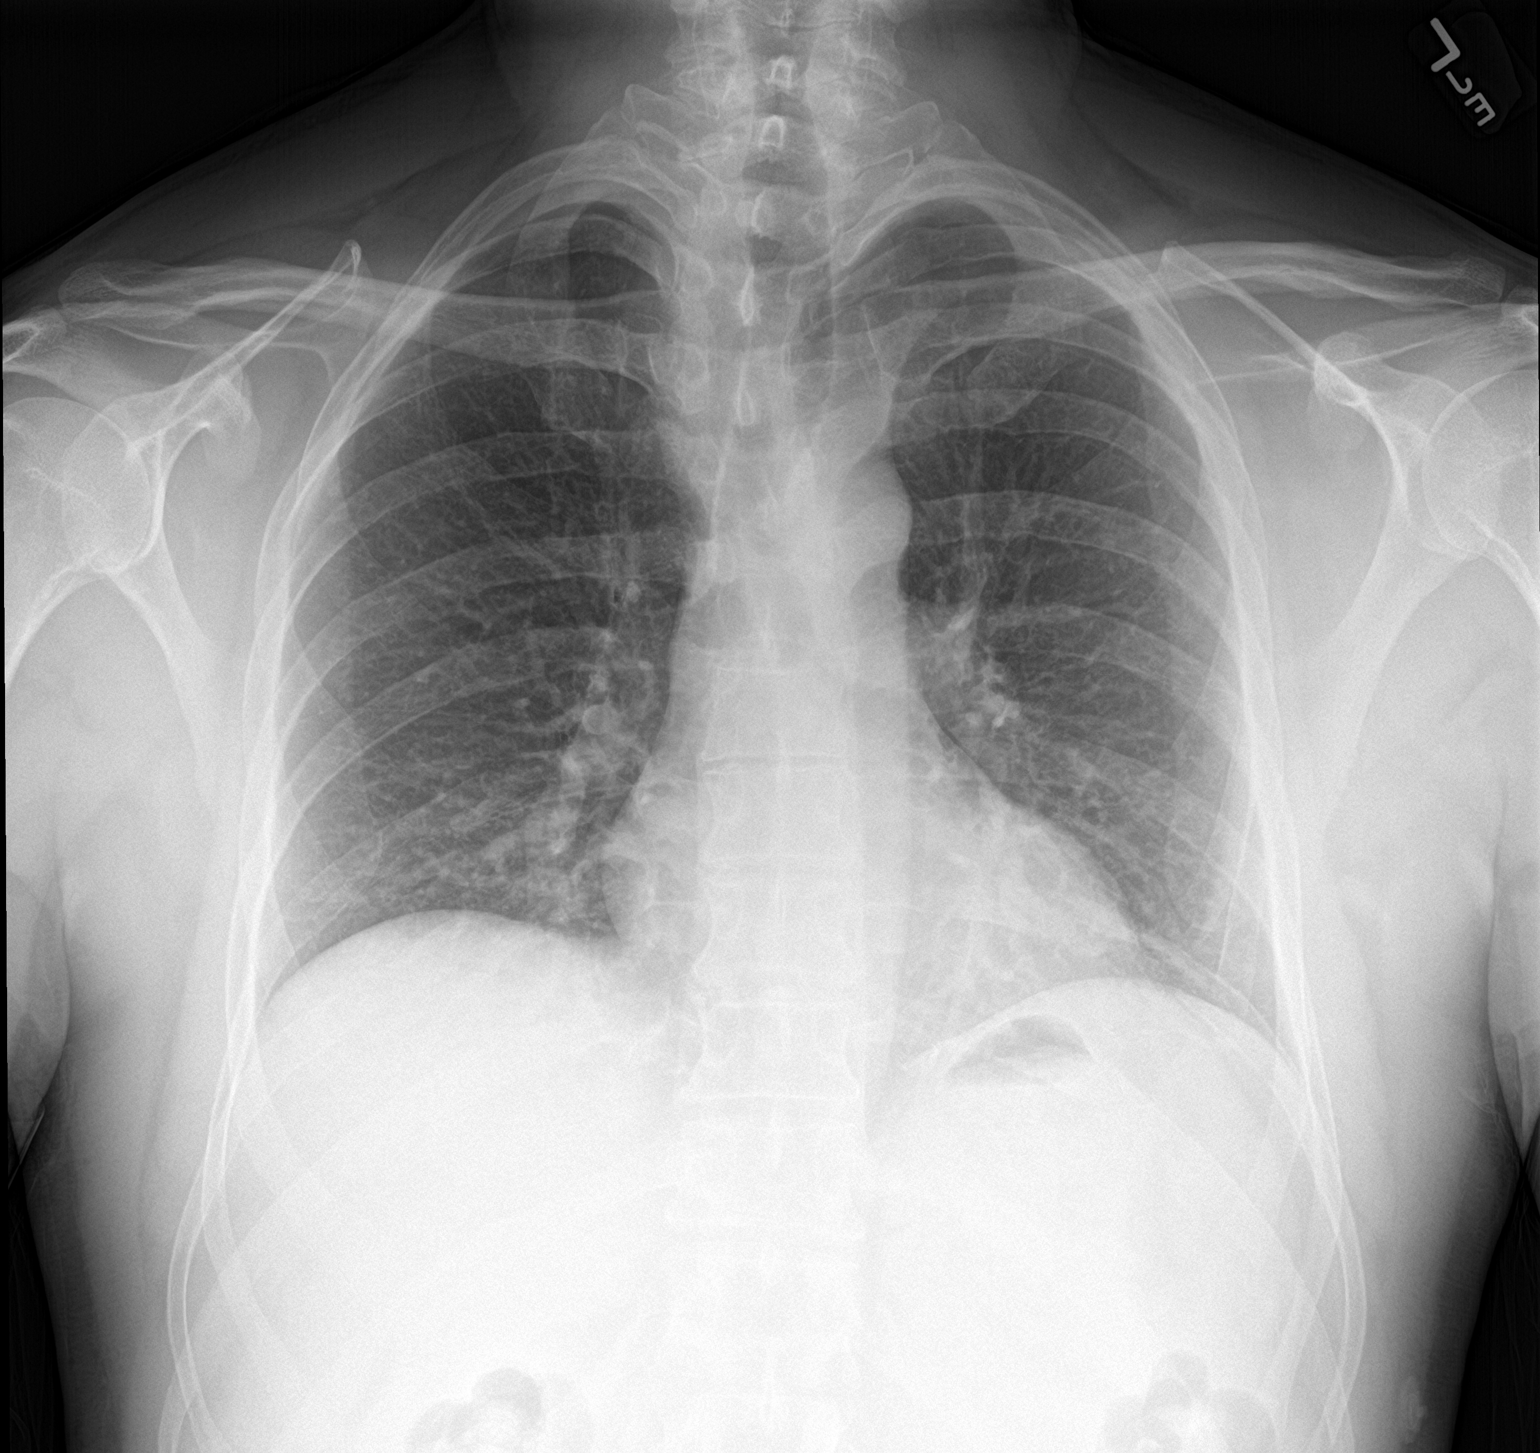

[chest lat]
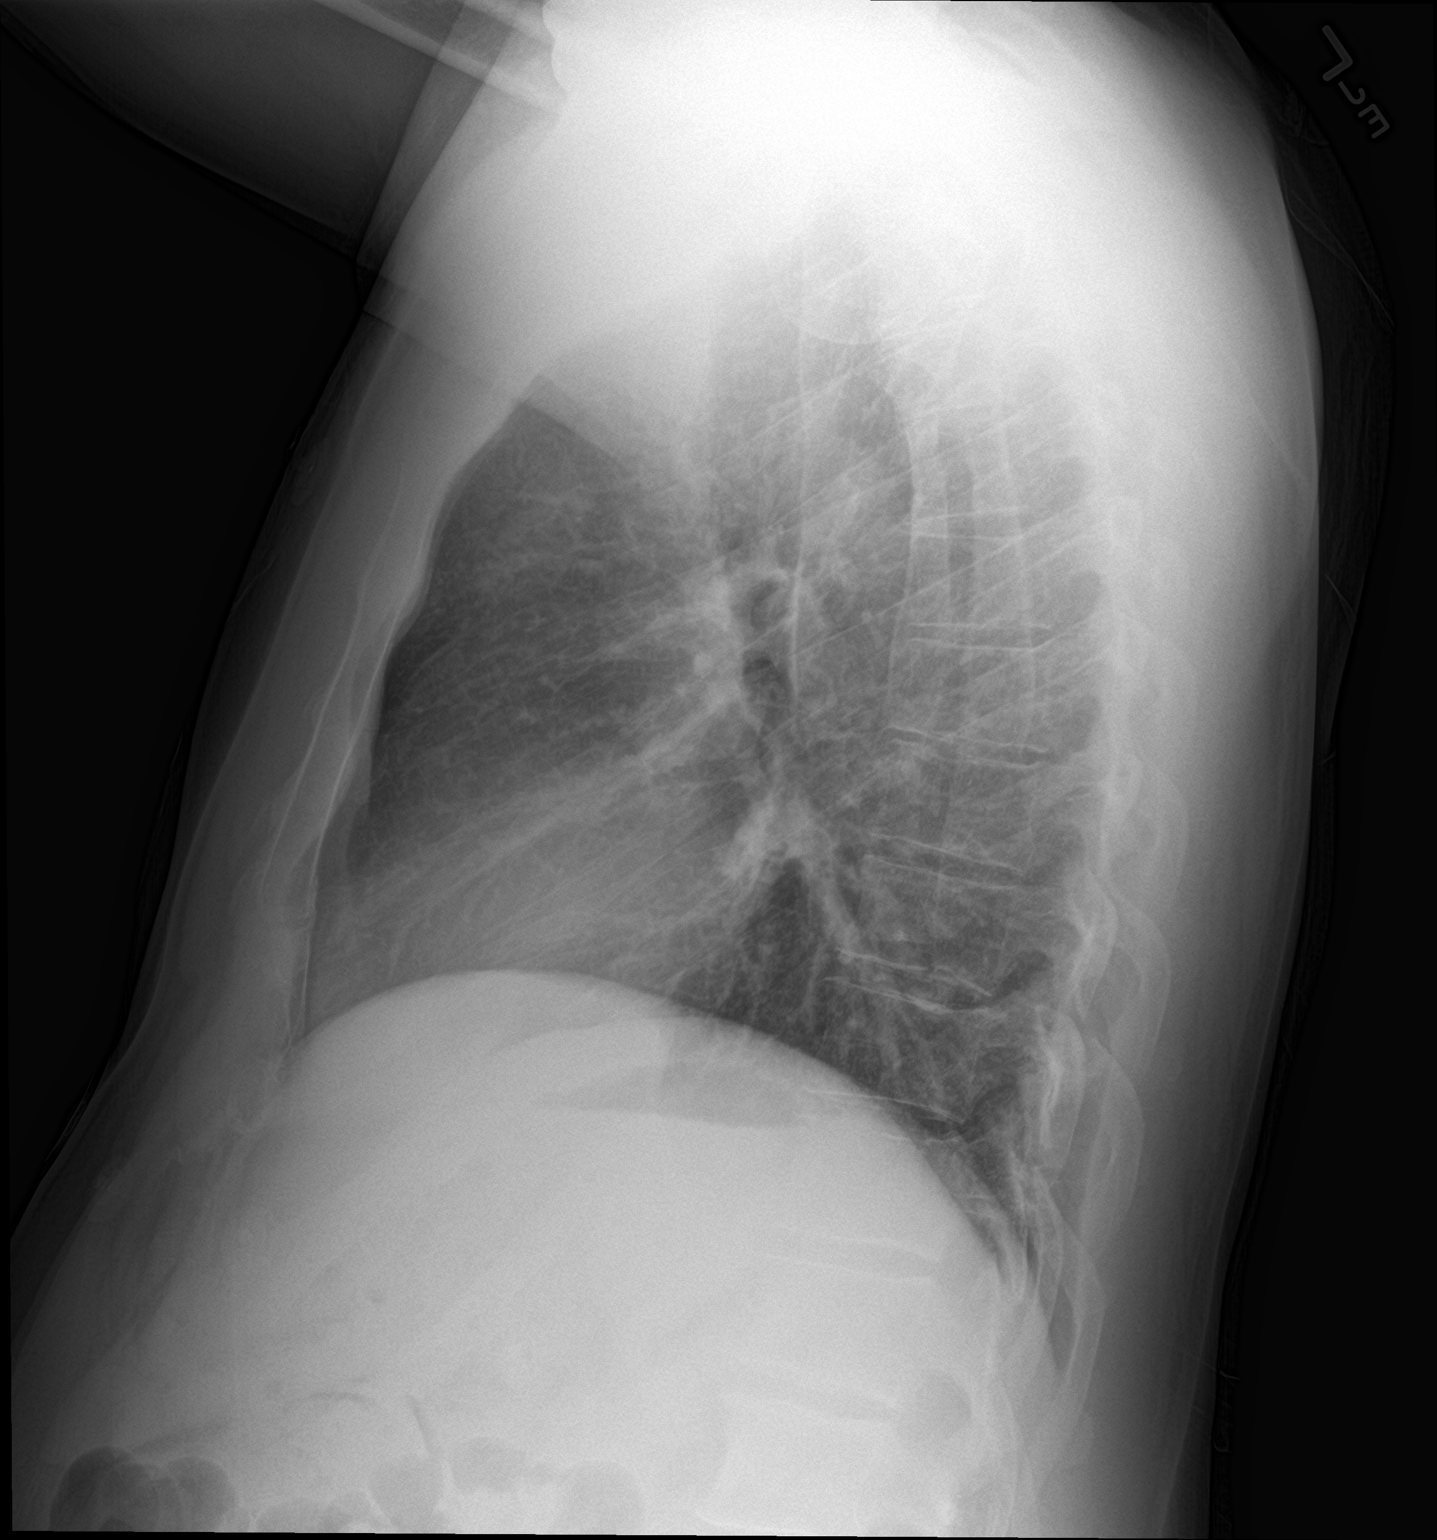

[2 of 2 positions shown; findings below may reference images not displayed]

FINDINGS: Frontal and lateral views of the chest demonstrate an unremarkable
cardiac silhouette. No acute airspace disease, effusion, or
pneumothorax. No acute bony abnormalities.
IMPRESSION: 1. No acute intrathoracic process.

## 2021-10-30 IMAGING — CT CT CARDIAC CORONARY ARTERY CALCIUM SCORE
3 series · 14 of 20 positions shown, 16 images · non-contrast
Comparison: None.

CLINICAL DATA: 50-year-old Caucasian male with family history of
heart disease and episodes of left-sided chest pain.

EXAM:
CT CARDIAC CORONARY ARTERY CALCIUM SCORE
TECHNIQUE: Non-contrast imaging through the heart was performed using
prospective ECG gating. Image post processing was performed on an
independent workstation, allowing for quantitative analysis of the
heart and coronary arteries. Note that this exam targets the heart
and the chest was not imaged in its entirety.

[Series 2: calcium scoring 2.00 qr36 bestdiast 71% hrt calciu · axial · 0.36mm/px · z∈[+1681,+1765]mm · 4 of 70 slices shown]
[im 14/70  vessel]
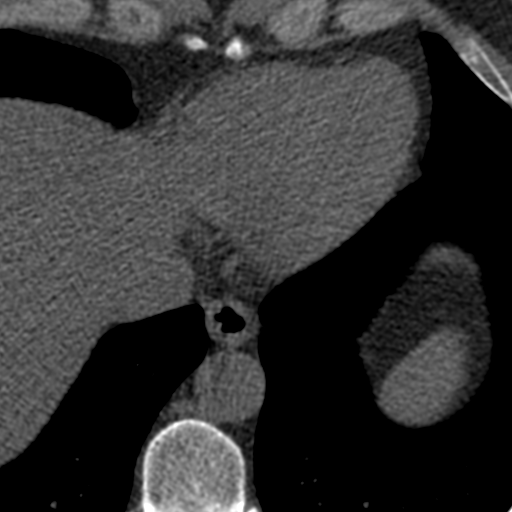
[im 28/70  vessel]
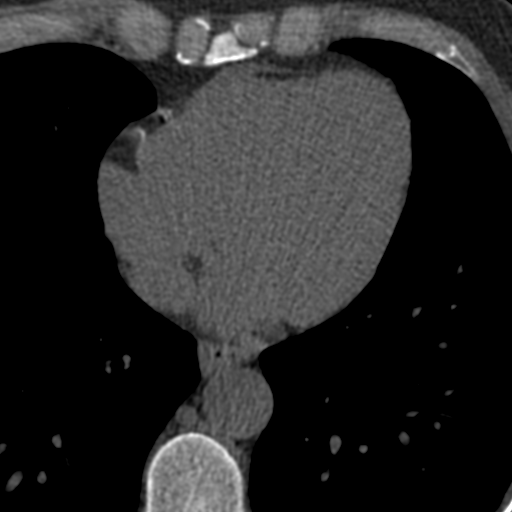
[im 42/70  vessel]
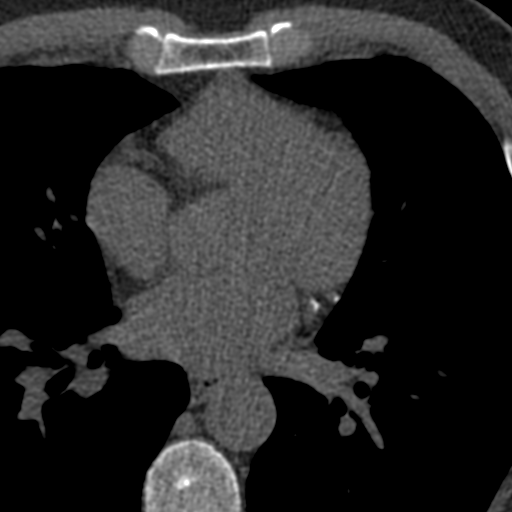
[im 56/70  vessel]
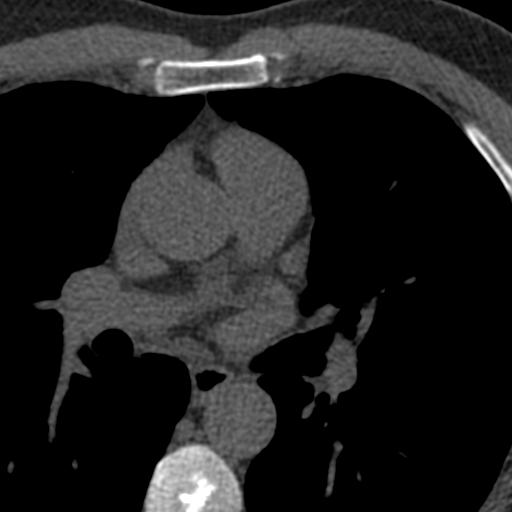

[Series 3: calcium scoring 2.00 br40 bestdiast 71% axial · axial · 0.57mm/px · z∈[+1677,+1769]mm · 5 of 70 slices shown, 7 images]
[im 12/70  vessel]
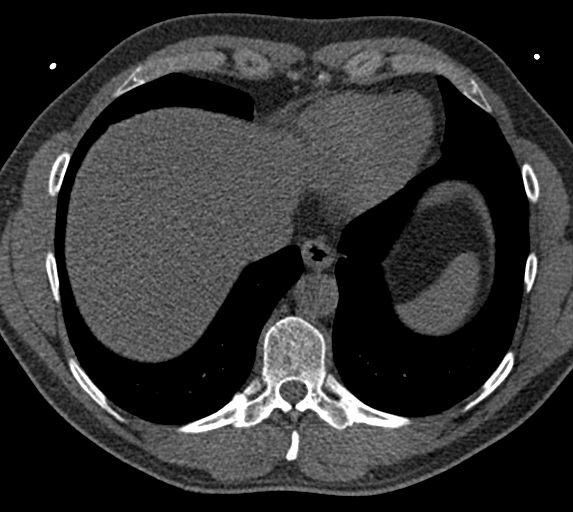
[im 12/70  lung]
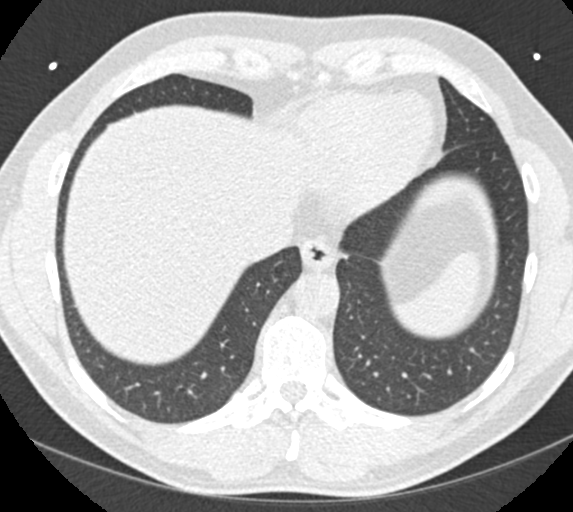
[im 24/70  vessel]
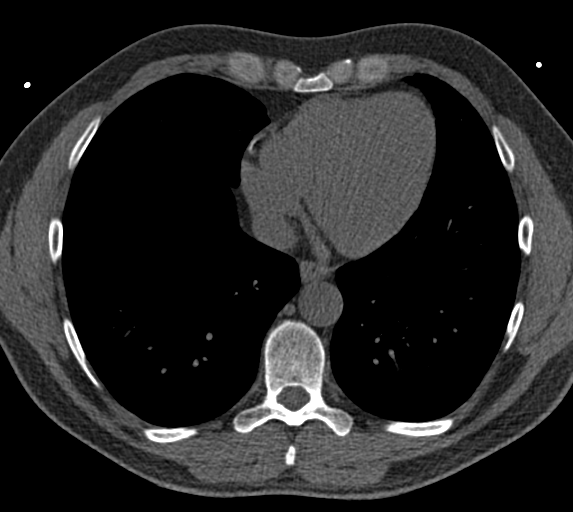
[im 35/70  vessel]
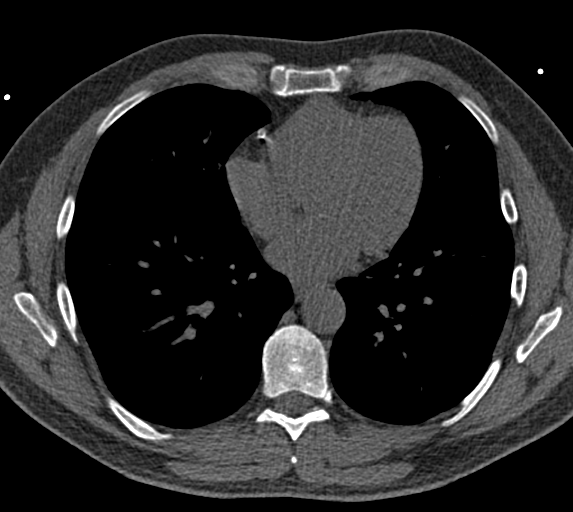
[im 47/70  vessel]
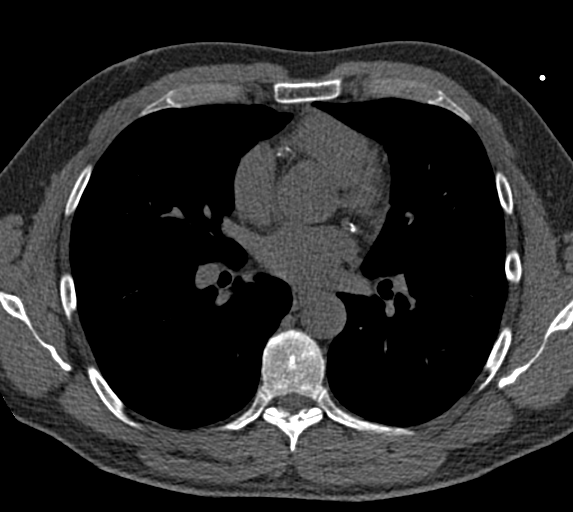
[im 58/70  vessel]
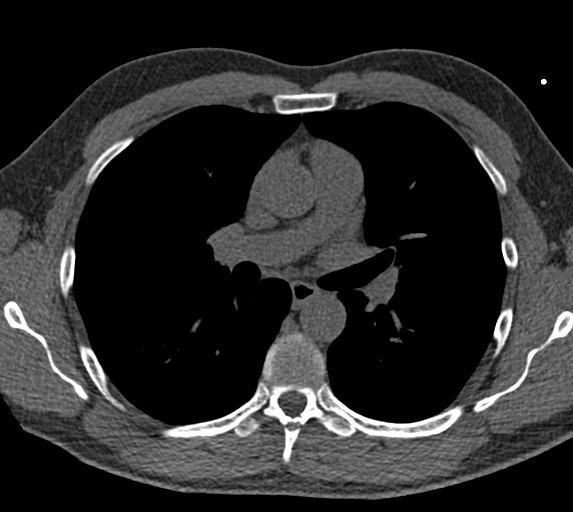
[im 58/70  lung]
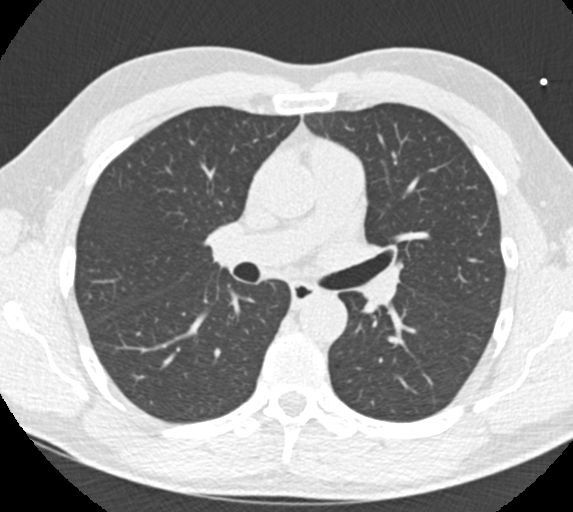

[Series 9: calcium scoring 2.00 br60 bestdiast 71% lungs · axial · 0.60mm/px · z∈[+1677,+1769]mm · 5 of 70 slices shown]
[im 12/70  vessel]
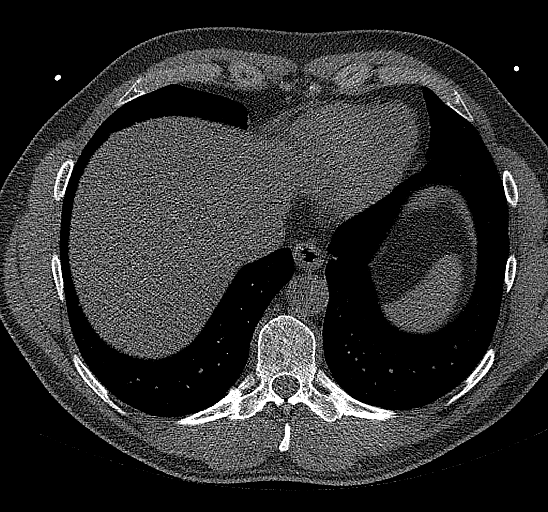
[im 24/70  vessel]
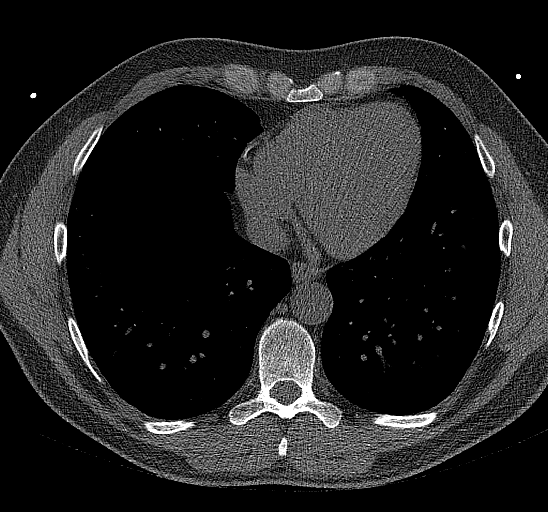
[im 35/70  vessel]
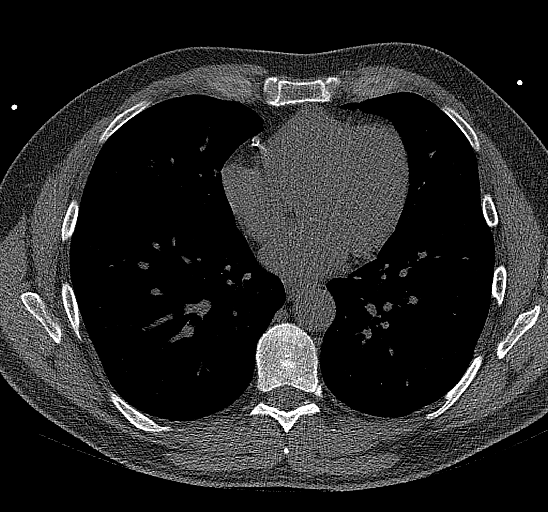
[im 47/70  vessel]
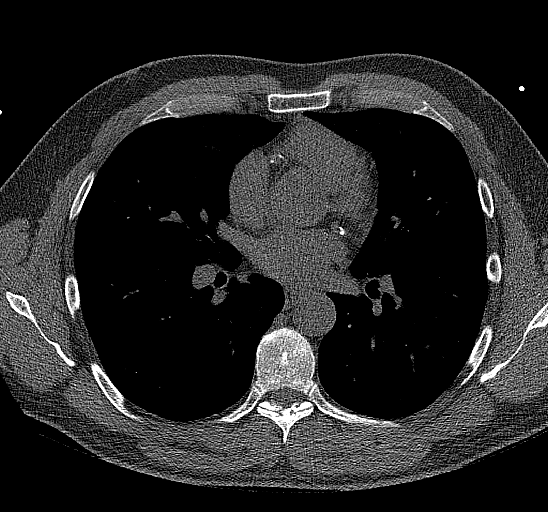
[im 58/70  vessel]
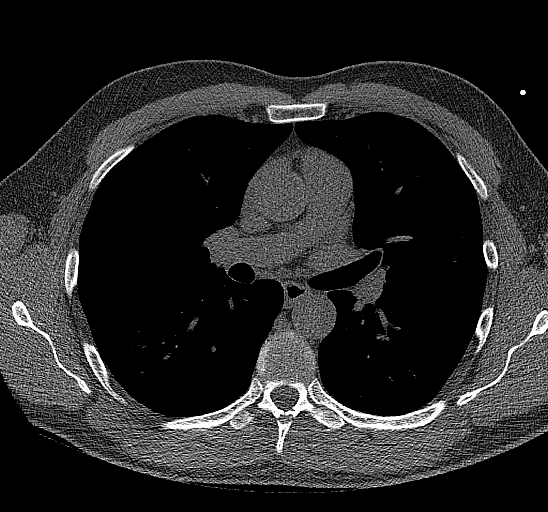

[14 of 20 positions shown; findings below may reference images not displayed]

FINDINGS: CORONARY CALCIUM SCORES:

Left Main: 0

LAD: 346

LCx: 378

RCA: 538

Total Agatston Score: 1,262

[HOSPITAL] percentile: 99

AORTA MEASUREMENTS:

Ascending Aorta: 31 mm

Descending Aorta: 26 mm

OTHER FINDINGS:

The heart size is within normal limits. No pericardial fluid is
identified. Visualized segments of the thoracic aorta and central
pulmonary arteries are normal in caliber. Visualized mediastinum and
hilar regions demonstrate no lymphadenopathy or masses. Visualized
lungs show no evidence of pulmonary edema, consolidation,
pneumothorax, nodule or pleural fluid. Visualized upper abdomen and
bony structures are unremarkable.
IMPRESSION: Coronary calcium score of 1,262 is at the 99th percentile for the
patient's age, sex and race.

## 2022-01-31 ENCOUNTER — Other Ambulatory Visit: Payer: Self-pay | Admitting: Cardiology

## 2022-01-31 DIAGNOSIS — I10 Essential (primary) hypertension: Secondary | ICD-10-CM
